# Patient Record
Sex: Female | Born: 1935 | Race: White | Hispanic: No | Marital: Married | Smoking: Former smoker
Health system: Southern US, Community
[De-identification: ages and names within clinical notes are randomized; demographics above are authoritative.]

## PROBLEM LIST (undated history)

## (undated) DIAGNOSIS — Z9889 Other specified postprocedural states: Secondary | ICD-10-CM

## (undated) DIAGNOSIS — Z720 Tobacco use: Secondary | ICD-10-CM

## (undated) DIAGNOSIS — I4891 Unspecified atrial fibrillation: Secondary | ICD-10-CM

## (undated) DIAGNOSIS — C801 Malignant (primary) neoplasm, unspecified: Secondary | ICD-10-CM

## (undated) DIAGNOSIS — R011 Cardiac murmur, unspecified: Secondary | ICD-10-CM

## (undated) DIAGNOSIS — I499 Cardiac arrhythmia, unspecified: Secondary | ICD-10-CM

## (undated) DIAGNOSIS — Z5189 Encounter for other specified aftercare: Secondary | ICD-10-CM

## (undated) DIAGNOSIS — I421 Obstructive hypertrophic cardiomyopathy: Secondary | ICD-10-CM

## (undated) DIAGNOSIS — N6019 Diffuse cystic mastopathy of unspecified breast: Secondary | ICD-10-CM

## (undated) DIAGNOSIS — S7291XA Unspecified fracture of right femur, initial encounter for closed fracture: Secondary | ICD-10-CM

## (undated) DIAGNOSIS — I10 Essential (primary) hypertension: Secondary | ICD-10-CM

## (undated) DIAGNOSIS — IMO0001 Reserved for inherently not codable concepts without codable children: Secondary | ICD-10-CM

## (undated) HISTORY — DX: Essential (primary) hypertension: I10

## (undated) HISTORY — DX: Other specified postprocedural states: Z98.890

## (undated) HISTORY — DX: Tobacco use: Z72.0

## (undated) HISTORY — DX: Malignant (primary) neoplasm, unspecified: C80.1

## (undated) HISTORY — DX: Cardiac murmur, unspecified: R01.1

## (undated) HISTORY — PX: OTHER SURGICAL HISTORY: SHX169

## (undated) HISTORY — DX: Cardiac arrhythmia, unspecified: I49.9

## (undated) HISTORY — DX: Diffuse cystic mastopathy of unspecified breast: N60.19

## (undated) HISTORY — DX: Unspecified fracture of right femur, initial encounter for closed fracture: S72.91XA

## (undated) HISTORY — DX: Encounter for other specified aftercare: Z51.89

## (undated) HISTORY — DX: Obstructive hypertrophic cardiomyopathy: I42.1

## (undated) HISTORY — DX: Reserved for inherently not codable concepts without codable children: IMO0001

---
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
## 1998-04-13 ENCOUNTER — Other Ambulatory Visit: Admission: RE | Admit: 1998-04-13 | Discharge: 1998-04-13 | Payer: Self-pay | Admitting: *Deleted

## 1999-05-14 ENCOUNTER — Other Ambulatory Visit: Admission: RE | Admit: 1999-05-14 | Discharge: 1999-05-14 | Payer: Self-pay | Admitting: *Deleted

## 1999-08-21 ENCOUNTER — Encounter: Admission: RE | Admit: 1999-08-21 | Discharge: 1999-08-21 | Payer: Self-pay | Admitting: *Deleted

## 1999-08-21 ENCOUNTER — Encounter: Payer: Self-pay | Admitting: *Deleted

## 2002-01-27 ENCOUNTER — Encounter: Payer: Self-pay | Admitting: Emergency Medicine

## 2002-01-27 ENCOUNTER — Inpatient Hospital Stay (HOSPITAL_COMMUNITY): Admission: EM | Admit: 2002-01-27 | Discharge: 2002-01-31 | Payer: Self-pay | Admitting: Emergency Medicine

## 2002-01-30 HISTORY — PX: CARDIAC CATHETERIZATION: SHX172

## 2002-11-22 ENCOUNTER — Ambulatory Visit (HOSPITAL_COMMUNITY): Admission: RE | Admit: 2002-11-22 | Discharge: 2002-11-22 | Payer: Self-pay | Admitting: Gastroenterology

## 2003-06-29 ENCOUNTER — Other Ambulatory Visit: Admission: RE | Admit: 2003-06-29 | Discharge: 2003-06-29 | Payer: Self-pay | Admitting: Gynecology

## 2004-07-20 ENCOUNTER — Emergency Department (HOSPITAL_COMMUNITY): Admission: EM | Admit: 2004-07-20 | Discharge: 2004-07-20 | Payer: Self-pay | Admitting: Emergency Medicine

## 2004-07-23 ENCOUNTER — Emergency Department (HOSPITAL_COMMUNITY): Admission: EM | Admit: 2004-07-23 | Discharge: 2004-07-23 | Payer: Self-pay | Admitting: Emergency Medicine

## 2004-08-07 ENCOUNTER — Encounter: Admission: RE | Admit: 2004-08-07 | Discharge: 2004-08-07 | Payer: Self-pay | Admitting: Gastroenterology

## 2005-06-26 HISTORY — PX: US ECHOCARDIOGRAPHY: HXRAD669

## 2005-06-30 ENCOUNTER — Other Ambulatory Visit: Admission: RE | Admit: 2005-06-30 | Discharge: 2005-06-30 | Payer: Self-pay | Admitting: Gynecology

## 2005-12-25 ENCOUNTER — Emergency Department (HOSPITAL_COMMUNITY): Admission: EM | Admit: 2005-12-25 | Discharge: 2005-12-25 | Payer: Self-pay | Admitting: *Deleted

## 2006-05-14 ENCOUNTER — Encounter: Admission: RE | Admit: 2006-05-14 | Discharge: 2006-05-14 | Payer: Self-pay | Admitting: Internal Medicine

## 2006-07-01 HISTORY — PX: US ECHOCARDIOGRAPHY: HXRAD669

## 2007-12-01 HISTORY — PX: US ECHOCARDIOGRAPHY: HXRAD669

## 2008-12-05 HISTORY — PX: US ECHOCARDIOGRAPHY: HXRAD669

## 2010-02-13 ENCOUNTER — Ambulatory Visit: Payer: Self-pay | Admitting: Cardiology

## 2010-04-25 ENCOUNTER — Emergency Department (HOSPITAL_COMMUNITY)
Admission: EM | Admit: 2010-04-25 | Discharge: 2010-04-25 | Payer: Self-pay | Source: Home / Self Care | Admitting: Emergency Medicine

## 2010-06-14 ENCOUNTER — Ambulatory Visit (INDEPENDENT_AMBULATORY_CARE_PROVIDER_SITE_OTHER): Payer: Medicare Other | Admitting: Cardiology

## 2010-06-14 DIAGNOSIS — I421 Obstructive hypertrophic cardiomyopathy: Secondary | ICD-10-CM

## 2010-06-14 DIAGNOSIS — I119 Hypertensive heart disease without heart failure: Secondary | ICD-10-CM

## 2010-08-16 NOTE — Op Note (Signed)
   Tricia [name], Tricia [name]                         ACCOUNT NO.:  [id number]   MEDICAL RECORD NO.:  [id number]                   PATIENT TYPE:  AMB   LOCATION:  ENDO                                 FACILITY:  [facility]   PHYSICIAN:  James L. Malon Kindle., M.D.          DATE OF BIRTH:  10-Dec-1925   DATE OF PROCEDURE:  11/22/2002  DATE OF DISCHARGE:                                 OPERATIVE REPORT   PROCEDURE:  Colonoscopy.   MEDICATIONS:  Fentanyl 60 mcg, Versed 6 mg IV.   SCOPE:  Olympus pediatric colonoscope.   INDICATIONS:  The patient with a strong family history of colon cancer.  One  of her siblings died of colon cancer at 66.   DESCRIPTION OF PROCEDURE:  The procedure had been explained to the patient  and consent obtained.  With the patient in the left lateral decubitus  position, the Olympus pediatric colonoscope was inserted and advanced under  direct visualization.  Prep was excellent.  We were able to advance to the  cecum.  The ileocecal valve and appendiceal orifice were seen.  The scope  was withdrawn.  The cecum, ascending colon, hepatic flexure, transverse  colon, splenic flexure, descending, and sigmoid colon were seen well upon  removal.  The descending colon and sigmoid colon were seen well and were  unremarkable.  The rectum was free of polyps.  The scope was withdrawn.  The  patient tolerated the procedure well and was maintained on a low-flow  oxygen.  Pulse oximetry throughout the procedure with no obvious problems.   ASSESSMENT:  Strong family history of colon cancer with no obvious polyps or  other lesions seen on colonoscopy.   PLAN:  Routine followup and yearly hemoccults.  Would recommend repeating  colonoscopy again in five years.                                                 James L. Malon Kindle., M.D.    Waldron Session  D:  11/22/2002  T:  11/22/2002  Job:  [id number]   cc:   Gwen Pounds, M.D.  [address]  [place]  [place] [address]  Fax:  [phone]

## 2010-10-31 ENCOUNTER — Encounter: Payer: Self-pay | Admitting: Cardiology

## 2010-11-01 ENCOUNTER — Ambulatory Visit (INDEPENDENT_AMBULATORY_CARE_PROVIDER_SITE_OTHER): Payer: Medicare Other | Admitting: Cardiology

## 2010-11-01 ENCOUNTER — Encounter: Payer: Self-pay | Admitting: Cardiology

## 2010-11-01 VITALS — BP 126/78 | HR 74 | Wt 126.0 lb

## 2010-11-01 DIAGNOSIS — Z7189 Other specified counseling: Secondary | ICD-10-CM

## 2010-11-01 DIAGNOSIS — Z716 Tobacco abuse counseling: Secondary | ICD-10-CM | POA: Insufficient documentation

## 2010-11-01 DIAGNOSIS — I421 Obstructive hypertrophic cardiomyopathy: Secondary | ICD-10-CM

## 2010-11-01 DIAGNOSIS — I119 Hypertensive heart disease without heart failure: Secondary | ICD-10-CM

## 2010-11-01 NOTE — Progress Notes (Signed)
Tricia [name] Date of Birth:  04/10/1925 [facility] [address]   [address] [place], [place]  [address] [phone]           Fax   [phone]  HPI: This pleasant 77 year old woman is seen for a scheduled 4 month followup office visit.  She has a history of idiopathic hypertrophic subaortic stenosis.  She also has a history of essential hypertension and history of ongoing cigarette usage.  We talked her about the need for quitting smoking today. Patient has not been expressing any chest pain or shortness of breath.  She's not been aware of any palpitations or tachycardia.  She does have a chronic nonproductive cough related to smoking.  She is on beta blockers which has reduced the intensity of her heart murmur.  Current Outpatient Prescriptions  Medication Sig Dispense Refill  . aspirin 81 MG tablet Take 81 mg by mouth every other day.        Marland Kitchen CALCIUM PO Take by mouth daily.        . metoprolol (TOPROL-XL) 100 MG 24 hr tablet Take 100 mg by mouth daily.        . Multiple Vitamin (MULTIVITAMIN) tablet Take 1 tablet by mouth daily.          Allergies  Allergen Reactions  . Actonel   . Zithromax (Azithromycin)     There is no problem list on file for this patient.   History  Smoking status  . Current Everyday Smoker -- 1.0 packs/day  Smokeless tobacco  . Not on file    History  Alcohol Use No    Family History  Problem Relation Age of Onset  . Heart attack Mother   . Diabetes Father     Review of Systems: The patient denies any heat or cold intolerance.  No weight gain or weight loss.  The patient denies headaches or blurry vision.  There is no cough or sputum production.  The patient denies dizziness.  There is no hematuria or hematochezia.  The patient denies any muscle aches or arthritis.  The patient denies any rash.  The patient denies frequent falling or instability.  There is no history of depression or anxiety.  All other systems  were reviewed and are negative.   Physical Exam: Filed Vitals:   11/01/10 1349  BP: 126/78  Pulse: 74   The general appearance feels a well-developed well-nourished woman in no distress.Pupils equal and reactive.   Extraocular Movements are full.  There is no scleral icterus.  The mouth and pharynx are normal.  The neck is supple.  The carotids reveal no bruits.  The jugular venous pressure is normal.  The thyroid is not enlarged.  There is no lymphadenopathy.  The chest is clear to percussion and auscultation. There are no rales or rhonchi. Expansion of the chest is symmetrical.    The heart reveals a grade 2/6 systolic ejection murmur at the base.  No diastolic murmur.  No gallop or rub.The abdomen is soft and nontender. Bowel sounds are normal. The liver and spleen are not enlarged. There Are no abdominal masses. There are no bruits.  The pedal pulses are good.  There is no phlebitis or edema.  There is no cyanosis or clubbing.  Strength is normal and symmetrical in all extremities.  There is no lateralizing weakness.  There are no sensory deficits.  The skin is warm and dry.  There is no rash.  Assessment / Plan: Continue same medication.  We did an EKG today which shows normal sinus rhythm and LVH and is unchanged from 02/13/10.  Recheck in 6 months for a followup office visit

## 2010-11-04 ENCOUNTER — Encounter: Payer: Self-pay | Admitting: Cardiology

## 2010-11-07 ENCOUNTER — Encounter: Payer: Self-pay | Admitting: Cardiology

## 2010-11-07 ENCOUNTER — Telehealth: Payer: Self-pay | Admitting: Cardiology

## 2010-11-07 ENCOUNTER — Other Ambulatory Visit: Payer: Self-pay | Admitting: Radiology

## 2010-11-07 DIAGNOSIS — C50912 Malignant neoplasm of unspecified site of left female breast: Secondary | ICD-10-CM

## 2010-11-07 NOTE — Telephone Encounter (Signed)
Faxed latest labs today °

## 2010-11-07 NOTE — Telephone Encounter (Signed)
Please fax latest labs

## 2010-11-12 ENCOUNTER — Encounter: Payer: Self-pay | Admitting: Cardiology

## 2010-11-12 ENCOUNTER — Ambulatory Visit
Admission: RE | Admit: 2010-11-12 | Discharge: 2010-11-12 | Disposition: A | Discharge status: Home / Self Care | Payer: Medicare Other | Source: Ambulatory Visit | Attending: Radiology | Admitting: Radiology

## 2010-11-12 DIAGNOSIS — C50912 Malignant neoplasm of unspecified site of left female breast: Secondary | ICD-10-CM

## 2010-11-12 MED ORDER — GADOBENATE DIMEGLUMINE 529 MG/ML IV SOLN
10.0000 mL | Freq: Once | INTRAVENOUS | Status: AC | PRN
  Administered 2010-11-12: 10 mL via INTRAVENOUS

## 2010-11-13 ENCOUNTER — Other Ambulatory Visit: Payer: Self-pay | Admitting: Oncology

## 2010-11-13 ENCOUNTER — Encounter (INDEPENDENT_AMBULATORY_CARE_PROVIDER_SITE_OTHER): Payer: Self-pay | Admitting: General Surgery

## 2010-11-13 ENCOUNTER — Encounter (HOSPITAL_BASED_OUTPATIENT_CLINIC_OR_DEPARTMENT_OTHER): Payer: Medicare Other | Admitting: Oncology

## 2010-11-13 ENCOUNTER — Ambulatory Visit (HOSPITAL_BASED_OUTPATIENT_CLINIC_OR_DEPARTMENT_OTHER): Payer: Medicare Other | Admitting: General Surgery

## 2010-11-13 VITALS — BP 164/89 | HR 70 | Temp 97.9°F | Resp 20 | Ht 63.0 in | Wt 127.1 lb

## 2010-11-13 DIAGNOSIS — C50919 Malignant neoplasm of unspecified site of unspecified female breast: Secondary | ICD-10-CM

## 2010-11-13 DIAGNOSIS — C50219 Malignant neoplasm of upper-inner quadrant of unspecified female breast: Secondary | ICD-10-CM

## 2010-11-13 DIAGNOSIS — C50419 Malignant neoplasm of upper-outer quadrant of unspecified female breast: Secondary | ICD-10-CM

## 2010-11-13 LAB — COMPREHENSIVE METABOLIC PANEL
ALT: 17 U/L (ref 0–35)
AST: 20 U/L (ref 0–37)
Albumin: 3.8 g/dL (ref 3.5–5.2)
Alkaline Phosphatase: 38 U/L — ABNORMAL LOW (ref 39–117)
Potassium: 4 mEq/L (ref 3.5–5.3)
Sodium: 136 mEq/L (ref 135–145)
Total Bilirubin: 0.5 mg/dL (ref 0.3–1.2)
Total Protein: 6.8 g/dL (ref 6.0–8.3)

## 2010-11-13 LAB — CBC WITH DIFFERENTIAL/PLATELET
BASO%: 0.2 % (ref 0.0–2.0)
EOS%: 1.2 % (ref 0.0–7.0)
Eosinophils Absolute: 0.1 10*3/uL (ref 0.0–0.5)
LYMPH%: 20.8 % (ref 14.0–49.7)
MCHC: 34.5 g/dL (ref 31.5–36.0)
MCV: 95.8 fL (ref 79.5–101.0)
MONO%: 4.9 % (ref 0.0–14.0)
NEUT#: 5.2 10*3/uL (ref 1.5–6.5)
Platelets: 171 10*3/uL (ref 145–400)
RBC: 4.34 10*6/uL (ref 3.70–5.45)
RDW: 14.2 % (ref 11.2–14.5)

## 2010-11-13 LAB — CANCER ANTIGEN 27.29: CA 27.29: 9 U/mL (ref 0–39)

## 2010-11-13 NOTE — Progress Notes (Signed)
Subjective:     Patient ID: Tricia [name], female   DOB: 1925/12/06, 77 y.o.   MRN: [id number]  HPI This is an 77 year old Caucasian female, referred by Dr. Minerva Areola storm at [facility] breast imaging. 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 Dr. Ronny Flurry is her primary care physician and cardiologist.  The patient has not had any significant breast disease in the past, although she did have a right breast biopsy in her 35s. She felt a painless mass in the medial aspect of her left breast recently.  MGM's and ultrasounds were done which showed some calcifications and a mass at the 10:00 position of the left breast. Image guided biopsy of that area showed invasive ductal carcinoma, ER-positive, HER-2-negative, T6-T7 10%. There was also a small palpable mass in the 2:00 position of the left breast and that also was biopsied and that showed invasive ductal carcinoma, ER-positive, HER-2 negative, Ki 67 25%. She has had an MRI which shows the 2 areas in the left breast. Right breast looks normal and there is no adenopathy.  She is here today to discuss the management options. She is being seen in the breast clinic by Dr. Pierce Crane, Dr. Basilio Cairo, and me.  Significant family history is that her mother developed breast cancer at age 65. She died 3 years later but that might have been due to coronary artery disease.  Patient has significant history of tobacco abuse and idiopathic hypertrophic subaortic stenosis and hypertension.  Past Medical History  Diagnosis Date  . Idiopathic hypertrophic subaortic stenosis   . Irregular heart beat   . Fibrocystic breast disease   . Tobacco abuse   . Hypertension   . History of colonoscopy    Current Outpatient Prescriptions  Medication Sig Dispense Refill  . glucosamine-chondroitin 500-400 MG tablet Take 500 tablets by mouth daily.        Marland Kitchen aspirin 81 MG tablet Take 81 mg by mouth every other day.        Marland Kitchen CALCIUM PO  Take by mouth daily.        . metoprolol (TOPROL-XL) 100 MG 24 hr tablet Take 50 mg by mouth daily.       . Multiple Vitamin (MULTIVITAMIN) tablet Take 1 tablet by mouth daily.         Allergies  Allergen Reactions  . Actonel   . Zithromax (Azithromycin)     Family History  Problem Relation Age of Onset  . Heart attack Mother   . Breast cancer Mother   . Diabetes Father   . Stomach cancer Brother     History  Substance Use Topics  . Smoking status: Current Everyday Smoker -- 1.0 packs/day  . Smokeless tobacco: Not on file  . Alcohol Use: Yes     Review of Systems  Constitutional: Negative.   HENT: Negative.   Eyes: Negative.   Respiratory: Positive for cough and shortness of breath. Negative for apnea, choking, chest tightness, wheezing and stridor.   Cardiovascular: Negative.   Gastrointestinal: Negative.   Genitourinary: Negative.   Neurological: Negative.   Hematological: Negative.   Psychiatric/Behavioral: Negative.        Objective:   Physical Exam  Constitutional: She is oriented to person, place, and time. She appears well-developed and well-nourished. No distress.  HENT:  Head: Normocephalic and atraumatic.  Nose: Nose normal.  Mouth/Throat: No oropharyngeal exudate.  Eyes: Conjunctivae and EOM are normal. Pupils are equal, round, and reactive  to light. Left eye exhibits no discharge. No scleral icterus.  Neck: Neck supple. No JVD present. No tracheal deviation present. No thyromegaly present.  Cardiovascular: Normal rate, regular rhythm, normal heart sounds and intact distal pulses.   No murmur heard. Pulmonary/Chest: Effort normal and breath sounds normal. No respiratory distress. She has no wheezes. She has no rales. She exhibits no tenderness.    Abdominal: Soft. Bowel sounds are normal. She exhibits no distension and no mass. There is no tenderness. There is no rebound and no guarding.    Musculoskeletal: She exhibits no edema and no tenderness.    Lymphadenopathy:    She has no cervical adenopathy.  Neurological: She is alert and oriented to person, place, and time. She exhibits normal muscle tone. Coordination normal.  Skin: Skin is warm. No rash noted. She is not diaphoretic. No erythema. No pallor.  Psychiatric: She has a normal mood and affect. Her behavior is normal. Judgment and thought content normal.       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 There is a T1 C. tumor at 10:00 and a T2 tumor at 2:00. These are ER-positive, HER-2-negative.  Extensive ecchymosis post biopsy.  Idiopathic hypertrophic subaortic stenosis.  Tobacco abuse, one pack per day.  Hypertension.    Plan:     I had a very, very long talk with the patient and her son. We talked about mastectomy and sentinel lobe biopsy. We talked about doing 2 lumpectomies and postop radiation therapy. We talked about neoadjuvant antiestrogen therapy. This was seemingly confusing to her.  It took a long time for her to come to a decision, but at the end of the 3 consultations and conversation we all decided to put her on neoadjuvant antiestrogen therapy, hopefully downstage both tumors.  and to plan for lumpectomy x2 in the future with adjuvant radiation therapy to follow. She will probably need antiestrogen therapy for 5 years.  She will followup with me in the office in 3 months.  She will followup with Dr. Pierce Crane frequently because of anti-estrogen therapy.  I offered to see her sooner if she had more questions or wanted to change her mind.  She was strongly encouraged to stop smoking.

## 2010-11-13 NOTE — Patient Instructions (Signed)
You have two invasive cancers in your left breast, one in the upper inner quadrant and one in the upper outer quadrant. We have talked about mastectomy and lymph node biopsy. We have talked about doing lumpectomy on both of these areas and postop radiation therapy. We have talked about shrinking the tumor is down with anti-estrogen pills. I have discussed this with your son as well. Our decision at this time is for you to undergo preoperative treatment with anti-estrogen pills which will be prescribed by Dr. Pierce Crane. I will see you every 3 months, and hopefully the tumors will shrink in size to the extent that I can safely do two separate lumpectomies and possible lymph node biopsy. You will need radiation therapy after that is done. My office will make an appointment to see me in 3 months. Please contact me if you have any questions or change your mind.

## 2010-11-19 ENCOUNTER — Ambulatory Visit
Admission: RE | Admit: 2010-11-19 | Discharge: 2010-11-19 | Disposition: A | Discharge status: Home / Self Care | Payer: Medicare Other | Source: Ambulatory Visit | Attending: Oncology | Admitting: Oncology

## 2010-11-19 DIAGNOSIS — C50919 Malignant neoplasm of unspecified site of unspecified female breast: Secondary | ICD-10-CM

## 2010-11-22 ENCOUNTER — Encounter (HOSPITAL_COMMUNITY)
Admission: RE | Admit: 2010-11-22 | Discharge: 2010-11-22 | Disposition: A | Discharge status: Home / Self Care | Payer: Medicare Other | Source: Ambulatory Visit | Attending: Oncology | Admitting: Oncology

## 2010-11-22 ENCOUNTER — Ambulatory Visit (HOSPITAL_COMMUNITY)
Admission: RE | Admit: 2010-11-22 | Discharge: 2010-11-22 | Disposition: A | Discharge status: Home / Self Care | Payer: Medicare Other | Source: Ambulatory Visit | Attending: Oncology | Admitting: Oncology

## 2010-11-22 ENCOUNTER — Other Ambulatory Visit (HOSPITAL_COMMUNITY): Payer: Medicare Other

## 2010-11-22 ENCOUNTER — Ambulatory Visit (HOSPITAL_COMMUNITY): Payer: Medicare Other

## 2010-11-22 ENCOUNTER — Encounter (HOSPITAL_COMMUNITY): Payer: Medicare Other

## 2010-11-22 DIAGNOSIS — E279 Disorder of adrenal gland, unspecified: Secondary | ICD-10-CM | POA: Insufficient documentation

## 2010-11-22 DIAGNOSIS — K449 Diaphragmatic hernia without obstruction or gangrene: Secondary | ICD-10-CM | POA: Insufficient documentation

## 2010-11-22 DIAGNOSIS — M47814 Spondylosis without myelopathy or radiculopathy, thoracic region: Secondary | ICD-10-CM | POA: Insufficient documentation

## 2010-11-22 DIAGNOSIS — C50919 Malignant neoplasm of unspecified site of unspecified female breast: Secondary | ICD-10-CM | POA: Insufficient documentation

## 2010-11-22 DIAGNOSIS — M5137 Other intervertebral disc degeneration, lumbosacral region: Secondary | ICD-10-CM | POA: Insufficient documentation

## 2010-11-22 DIAGNOSIS — M899 Disorder of bone, unspecified: Secondary | ICD-10-CM | POA: Insufficient documentation

## 2010-11-22 DIAGNOSIS — Q762 Congenital spondylolisthesis: Secondary | ICD-10-CM | POA: Insufficient documentation

## 2010-11-22 DIAGNOSIS — M51379 Other intervertebral disc degeneration, lumbosacral region without mention of lumbar back pain or lower extremity pain: Secondary | ICD-10-CM | POA: Insufficient documentation

## 2010-11-22 MED ORDER — IOHEXOL 300 MG/ML  SOLN
100.0000 mL | Freq: Once | INTRAMUSCULAR | Status: AC | PRN
  Administered 2010-11-22: 100 mL via INTRAVENOUS

## 2010-11-22 MED ORDER — TECHNETIUM TC 99M MEDRONATE IV KIT
25.0000 | PACK | Freq: Once | INTRAVENOUS | Status: AC | PRN
  Administered 2010-11-22: 25 via INTRAVENOUS

## 2010-11-26 ENCOUNTER — Encounter: Payer: Self-pay | Admitting: Cardiology

## 2010-12-03 ENCOUNTER — Other Ambulatory Visit (HOSPITAL_COMMUNITY): Payer: Medicare Other

## 2010-12-03 ENCOUNTER — Ambulatory Visit (HOSPITAL_COMMUNITY): Payer: Medicare Other

## 2010-12-03 ENCOUNTER — Encounter (HOSPITAL_COMMUNITY): Payer: Medicare Other

## 2010-12-19 ENCOUNTER — Other Ambulatory Visit: Payer: Self-pay | Admitting: Oncology

## 2010-12-19 ENCOUNTER — Encounter (HOSPITAL_BASED_OUTPATIENT_CLINIC_OR_DEPARTMENT_OTHER): Payer: Medicare Other | Admitting: Oncology

## 2010-12-19 DIAGNOSIS — C50219 Malignant neoplasm of upper-inner quadrant of unspecified female breast: Secondary | ICD-10-CM

## 2010-12-19 LAB — CBC WITH DIFFERENTIAL/PLATELET
BASO%: 0.5 % (ref 0.0–2.0)
HCT: 44.2 % (ref 34.8–46.6)
LYMPH%: 18.7 % (ref 14.0–49.7)
MCH: 33.4 pg (ref 25.1–34.0)
MCHC: 34.7 g/dL (ref 31.5–36.0)
MONO#: 0.4 10*3/uL (ref 0.1–0.9)
NEUT%: 74.7 % (ref 38.4–76.8)
Platelets: 160 10*3/uL (ref 145–400)
WBC: 8.1 10*3/uL (ref 3.9–10.3)

## 2010-12-20 LAB — COMPREHENSIVE METABOLIC PANEL
ALT: 14 U/L (ref 0–35)
CO2: 27 mEq/L (ref 19–32)
Creatinine, Ser: 0.79 mg/dL (ref 0.50–1.10)
Total Bilirubin: 0.6 mg/dL (ref 0.3–1.2)

## 2010-12-20 LAB — VITAMIN D 25 HYDROXY (VIT D DEFICIENCY, FRACTURES): Vit D, 25-Hydroxy: 23 ng/mL — ABNORMAL LOW (ref 30–89)

## 2010-12-26 ENCOUNTER — Encounter (HOSPITAL_BASED_OUTPATIENT_CLINIC_OR_DEPARTMENT_OTHER): Payer: Medicare Other | Admitting: Oncology

## 2010-12-26 DIAGNOSIS — C50919 Malignant neoplasm of unspecified site of unspecified female breast: Secondary | ICD-10-CM

## 2010-12-26 DIAGNOSIS — Z170 Estrogen receptor positive status [ER+]: Secondary | ICD-10-CM

## 2010-12-26 DIAGNOSIS — E559 Vitamin D deficiency, unspecified: Secondary | ICD-10-CM

## 2010-12-26 DIAGNOSIS — N63 Unspecified lump in unspecified breast: Secondary | ICD-10-CM

## 2011-01-03 ENCOUNTER — Encounter (HOSPITAL_BASED_OUTPATIENT_CLINIC_OR_DEPARTMENT_OTHER): Payer: Medicare Other | Admitting: Oncology

## 2011-01-03 DIAGNOSIS — M810 Age-related osteoporosis without current pathological fracture: Secondary | ICD-10-CM

## 2011-01-03 DIAGNOSIS — C50119 Malignant neoplasm of central portion of unspecified female breast: Secondary | ICD-10-CM

## 2011-01-11 ENCOUNTER — Other Ambulatory Visit: Payer: Self-pay | Admitting: Cardiology

## 2011-01-13 NOTE — Telephone Encounter (Signed)
Refilled toprol

## 2011-01-20 ENCOUNTER — Telehealth: Payer: Self-pay | Admitting: Cardiology

## 2011-01-20 NOTE — Telephone Encounter (Signed)
Left message with name of group and if any more questions to call back

## 2011-01-20 NOTE — Telephone Encounter (Signed)
Pt's son wants to know any general doctor that she can go to for signs of early dementia. He lives in [place] and doesn't know any in area.  Please call

## 2011-02-03 ENCOUNTER — Other Ambulatory Visit (HOSPITAL_BASED_OUTPATIENT_CLINIC_OR_DEPARTMENT_OTHER): Payer: Medicare Other | Admitting: Lab

## 2011-02-03 ENCOUNTER — Other Ambulatory Visit: Payer: Self-pay | Admitting: Oncology

## 2011-02-03 DIAGNOSIS — C50919 Malignant neoplasm of unspecified site of unspecified female breast: Secondary | ICD-10-CM

## 2011-02-03 LAB — CBC WITH DIFFERENTIAL/PLATELET
Eosinophils Absolute: 0.1 10*3/uL (ref 0.0–0.5)
LYMPH%: 17.8 % (ref 14.0–49.7)
MONO#: 0.4 10*3/uL (ref 0.1–0.9)
NEUT#: 5.1 10*3/uL (ref 1.5–6.5)
Platelets: 157 10*3/uL (ref 145–400)
RBC: 4.4 10*6/uL (ref 3.70–5.45)
WBC: 6.9 10*3/uL (ref 3.9–10.3)

## 2011-02-04 LAB — COMPREHENSIVE METABOLIC PANEL
AST: 19 U/L (ref 0–37)
Albumin: 4.2 g/dL (ref 3.5–5.2)
Alkaline Phosphatase: 31 U/L — ABNORMAL LOW (ref 39–117)
Chloride: 98 mEq/L (ref 96–112)
Glucose, Bld: 114 mg/dL — ABNORMAL HIGH (ref 70–99)
Potassium: 4.3 mEq/L (ref 3.5–5.3)
Sodium: 134 mEq/L — ABNORMAL LOW (ref 135–145)
Total Protein: 6.4 g/dL (ref 6.0–8.3)

## 2011-02-04 LAB — VITAMIN D 25 HYDROXY (VIT D DEFICIENCY, FRACTURES): Vit D, 25-Hydroxy: 32 ng/mL (ref 30–89)

## 2011-02-05 ENCOUNTER — Telehealth (INDEPENDENT_AMBULATORY_CARE_PROVIDER_SITE_OTHER): Payer: Self-pay

## 2011-02-05 NOTE — Telephone Encounter (Signed)
Per Dr Jacinto Halim order pt given ltf 21mo reck appt for 03-03-11 to f/u neoadj. Treatment.

## 2011-02-10 ENCOUNTER — Telehealth: Payer: Self-pay | Admitting: *Deleted

## 2011-02-10 ENCOUNTER — Ambulatory Visit (HOSPITAL_BASED_OUTPATIENT_CLINIC_OR_DEPARTMENT_OTHER): Payer: Medicare Other | Admitting: Oncology

## 2011-02-10 VITALS — BP 146/85 | HR 69 | Temp 97.9°F | Ht 63.0 in | Wt 124.3 lb

## 2011-02-10 DIAGNOSIS — F17200 Nicotine dependence, unspecified, uncomplicated: Secondary | ICD-10-CM

## 2011-02-10 DIAGNOSIS — Z170 Estrogen receptor positive status [ER+]: Secondary | ICD-10-CM

## 2011-02-10 DIAGNOSIS — E559 Vitamin D deficiency, unspecified: Secondary | ICD-10-CM

## 2011-02-10 DIAGNOSIS — C50919 Malignant neoplasm of unspecified site of unspecified female breast: Secondary | ICD-10-CM

## 2011-02-10 NOTE — Progress Notes (Deleted)
Hematology and Oncology Follow Up Visit  Tricia [name] [id number] [birth year] 77 y.o. 02/10/2011 12:00 PM  Error pls see dictated note Principle Diagnosis: ***  Interim History:  ***  Medications: {medication reviewed/display:3041432}  Allergies: No Active Allergies  Past Medical History, Surgical history, Social history, and Family History were reviewed and updated.  Review of Systems: Constitutional:  Negative for fever, chills, night sweats, anorexia, weight loss, pain. Cardiovascular: {roscv:310661} Respiratory: {ros resp:310659} Neurological: {rosneuro:310663} Dermatological: {ros skin:310673} ENT: {rosent:310657} Skin Gastrointestinal: {ros gi:310669} Genito-Urinary: {rosgu:310671} Hematological and Lymphatic: {rosheme/lymph:310665} Breast: {ros breast:311036} Musculoskeletal: {ros musculoskeletal:310677} Remaining ROS negative.  Physical Exam: Blood pressure 146/85, pulse 69, temperature 97.9 F (36.6 C), height 5\' 3"  (1.6 m), weight 124 lb 4.8 oz (56.382 kg). ECOG:  General appearance: {general exam:16600} Head: {head exam:30909::"Normocephalic, without obvious abnormality","atraumatic"} Neck: {neck exam:17463::"no adenopathy","no carotid bruit","no JVD","supple, symmetrical, trachea midline","thyroid not enlarged, symmetric, no tenderness/mass/nodules"} Lymph nodes: {lymph node exam:14039::"Cervical, supraclavicular, and axillary nodes normal."} Cardiac :  Pulmonary: Breasts: Abdomen: Extremities Neuro:  Lab Results: Lab Results  Component Value Date   HGB 14.5 02/03/2011   HCT 42.2 02/03/2011   MCV 95.9 02/03/2011   PLT 157 02/03/2011     Chemistry      Component Value Date/Time   NA 134* 02/03/2011 1133   K 4.3 02/03/2011 1133   CL 98 02/03/2011 1133   CO2 28 02/03/2011 1133   BUN 21 02/03/2011 1133   CREATININE 0.77 02/03/2011 1133      Component Value Date/Time   CALCIUM 9.5 02/03/2011 1133   ALKPHOS 31* 02/03/2011 1133   AST 19 02/03/2011 1133   ALT 14 02/03/2011 1133   BILITOT 0.5 02/03/2011 1133       Radiological Studies: chest X-ray *** Mammogram *** Bone density ***  Impression and Plan: ***  More than 50% of the visit was spent in patient-related counselling   Pierce Crane, MD 11/12/201212:00 PM

## 2011-02-10 NOTE — Telephone Encounter (Signed)
GAVE PATIENT APPOINTMENT FOR ULTRASOUND OF THE LEFT BREAST AT THE [facility] ON 03-21-2011 AT 10:00AM.

## 2011-02-10 NOTE — Progress Notes (Signed)
CC:   Angelia Mould. Derrell Lolling, M.D. [name], M.D. Gwen Pounds, MD  PROBLEM: 1. Locally advanced ER positive, PR positive breast cancer, on Femara     since August 2012. 2. History of smoking. 3. History of likely dementia. 4. History of idiopathic hypertrophic subaortic stenosis.  INTERVAL HISTORY:  Ms. Griffiths is here with her son from [place].  He is visiting with his parents to try to get her care facilitated with a primary care doctor as he has ongoing concerns about her likely dementia.  She is on Femara and tolerates it pretty well.  She thinks that the mass in her left breast may be somewhat smaller.  Other than that, she seems to be doing well.  She had no other incurrent illnesses or complaints.  She has not seen Dr. Derrell Lolling recently.  PHYSICAL EXAMINATION:  General:  Pleasant, alert woman looking stated age.  Vital Signs: Blood pressure is 146/85, temperature 97.9, pulse 69,.  Weight is 124.  ECOG is 1.  Head and Neck Exam:  No palpable peripheral adenopathy.  Oropharynx normal.  Lungs:  Clear.  Heart: Sounds are normal.  Systolic murmur heard left sternal border, 2/6.  No bruit.  Breasts:  Right breast normal.  Left breast has an easily palpable mass in the upper outer quadrant of the breast.  This is unchanged.  In the medial breast, there is slight residual nodularity that I can appreciate.  I cannot really detect discrete size of this mass.  Both axillae are negative.  There is no nipple retraction or skin changes.  Abdomen:  Soft.  There is no palpable hepatosplenomegaly.  No inguinal adenopathy.  Extremities:  No peripheral cyanosis, clubbing or edema.  LABORATORY DATA:  Within normal limits.  CBC is within normal limits.  IMPRESSION AND PLAN:  Ms. Sloma is doing well.  We will follow her up with an ultrasound in 6 weeks and have her see Dr. Derrell Lolling.  I will see if Dr. Timothy Lasso can also pick her up in his practice to fully assess her dementia and see what other  medical issues need to be dealt with.  She, of course, still smokes.  I will see her in about 2 months' time.    ______________________________ Pierce Crane, M.D., F.R.C.P.C. PR/MEDQ  D:  02/10/2011  T:  02/10/2011  Job:  [id number]

## 2011-02-10 NOTE — Telephone Encounter (Signed)
GAVE PATIENT APPOINTMENT FOR 05-2011 

## 2011-03-03 ENCOUNTER — Ambulatory Visit (INDEPENDENT_AMBULATORY_CARE_PROVIDER_SITE_OTHER): Payer: Medicare Other | Admitting: General Surgery

## 2011-03-03 ENCOUNTER — Telehealth (INDEPENDENT_AMBULATORY_CARE_PROVIDER_SITE_OTHER): Payer: Self-pay

## 2011-03-03 NOTE — Telephone Encounter (Signed)
I have mailed note advising pt she missed her appt today and have also sent her a card with her new appt date of 04-02-11. I see she has an ultrasound at [facility] 12-20 and Dr Gita Kudo note indicated pt is to f/u with Dr Derrell Lolling after u/s.

## 2011-04-02 ENCOUNTER — Ambulatory Visit (INDEPENDENT_AMBULATORY_CARE_PROVIDER_SITE_OTHER): Payer: Medicare Other | Admitting: General Surgery

## 2011-04-02 ENCOUNTER — Encounter (INDEPENDENT_AMBULATORY_CARE_PROVIDER_SITE_OTHER): Payer: Self-pay | Admitting: General Surgery

## 2011-04-02 VITALS — BP 132/76 | HR 70 | Temp 98.1°F | Resp 18 | Ht 65.0 in | Wt 123.2 lb

## 2011-04-02 DIAGNOSIS — C50912 Malignant neoplasm of unspecified site of left female breast: Secondary | ICD-10-CM

## 2011-04-02 DIAGNOSIS — C50919 Malignant neoplasm of unspecified site of unspecified female breast: Secondary | ICD-10-CM

## 2011-04-02 NOTE — Patient Instructions (Signed)
You will be scheduled for a left breast ultrasound at this time. Keep your appointment with Dr. Donnie Coffin. Return to see me in 6 weeks. At her next visit we will decide whether to continue treatment of the cancer with the Femara medicine, or whether to go ahead with surgery.

## 2011-04-02 NOTE — Progress Notes (Signed)
Patient ID: Tricia [name], female   DOB: 02-26-1926, 78 y.o.   MRN: [id number]  Chief Complaint  Patient presents with  . Breast Cancer Long Term Follow Up    Follow up to neoadj terapy. U/S 03/21/11.    HPI Tricia [name] is a 78 y.o. female.  She returned with her husband and one of her sons regarding her left breast cancer, which is being treated in the neoadjuvant setting.  This patient was initially seen in the breast clinic on November 13, 2010 by Dr. Donnie Coffin, Dr. Basilio Cairo, and me.  She has suspected  dementia and has poor memory. We had difficulty explaining her situation to her.  She has multicentric cancer of the left breast. There is invasive ductal carcinoma in the upper inner quadrant of the right breast, and a palpable invasive carcinoma in the upper outer quadrant of the left breast. These are receptor positive, HER-2/neu negative. The initial treatment plan was to treat this in the neoadjuvant setting with Femara in hopes of downstaging of tumor and be able to do 2 lumpectomies. She was given the alternative of mastectomy and did not like that idea. Her level of insight and understanding is under question.  She last saw Dr. Donnie Coffin on February 10, 2011. He scheduled her for a left breast ultrasound which was canceled for some reason. She is here today with her husband. They both had poor insight. The husband states that he knows she has breast cancer. She says that no one has ever told her she had breast cancer. We brought her son in the room and he states that they both have serious memory problems and that's why he lives with them. Another son lives in [place].  In any event, she states she is taking a Femara and has no new complaints about her breast. She does not know that she was supposed to get an ultrasound.HPI  Past Medical History  Diagnosis Date  . Idiopathic hypertrophic subaortic stenosis   . Irregular heart beat   . Fibrocystic breast disease   . Tobacco abuse   .  Hypertension   . History of colonoscopy   . Heart murmur   . Blood transfusion   . Cancer     left breast    Past Surgical History  Procedure Date  . Cardiac catheterization 01/30/2002    EF 75-80%  . US echocardiography 12/05/2008    EF 60-65%  . US echocardiography 12/01/2007    EF 60-65%  . US echocardiography 07/01/2006    EF 55-60%  . US echocardiography 06/26/2005    EF 55-60%  . Appendectomy 1946    Family History  Problem Relation Age of Onset  . Heart attack Mother   . Breast cancer Mother   . Cancer Mother     breast  . Diabetes Father   . Stomach cancer Brother   . Cancer Brother     colon    Social History History  Substance Use Topics  . Smoking status: Current Everyday Smoker -- 1.0 packs/day  . Smokeless tobacco: Never Used  . Alcohol Use: Yes     1.5 oz of rum in afternoon    No Known Allergies  Current Outpatient Prescriptions  Medication Sig Dispense Refill  . metoprolol tartrate (LOPRESSOR) 25 MG tablet Take 25 mg by mouth daily. Patient unsure of dosage. To be confirmed on next visit.       Marland Kitchen aspirin 81 MG tablet Take 81 mg by mouth every  other day.        Marland Kitchen CALCIUM PO Take by mouth daily.        Marland Kitchen glucosamine-chondroitin 500-400 MG tablet Take 1 tablet by mouth daily.       Marland Kitchen letrozole (FEMARA) 2.5 MG tablet Take 2.5 mg by mouth daily.        . Multiple Vitamin (MULTIVITAMIN) tablet Take 1 tablet by mouth daily.        . TOPROL XL 100 MG 24 hr tablet TAKE 1 TABLET EACH DAY.  30 each  11    Review of Systems Review of Systems  Respiratory: Negative for apnea, chest tightness and shortness of breath.   Cardiovascular: Negative for chest pain and palpitations.  Gastrointestinal: Negative for vomiting and abdominal distention.  Psychiatric/Behavioral: Positive for confusion. The patient is nervous/anxious.     Blood pressure 132/76, pulse 70, temperature 98.1 F (36.7 C), temperature source Temporal, resp. rate 18, height 5\' 5"  (1.651 m),  weight 123 lb 3.2 oz (55.883 kg).  Physical Exam Physical Exam  Neck: Normal range of motion. Neck supple. No JVD present.  Cardiovascular: Normal rate, regular rhythm and normal heart sounds.   No murmur heard. Pulmonary/Chest: Breath sounds normal. No respiratory distress. She has no wheezes. She has no rales.    Lymphadenopathy:    She has no cervical adenopathy.    Data Reviewed I have reviewed Dr. Renelda Loma office notes. I am going to attempt to reschedule her ultrasound.  Assessment    Multicentric invasive cancer left breast, upper inner quadrant  (nonpalpable)  and upper outer quadrant (palpable) . I am not sure whether she is responding to Femara or not.  Tumors are receptor positive, HER-2 negative.  Suspect significant dementia  Tobacco abuse  History of idiopathic hypertrophic subaortic stenosis, followed by Dr. Matilde Haymaker and Dr. Toy Baker.    Plan    Left breast ultrasound is rescheduled.  Await medical evaluation of dementia and medical problems by Dr. Timothy Lasso.  Continue Femara  I have told the patient and her family that if she still wanted to consider 2 lumpectomies that we will try to continue the Femara. If she changes her mind and wants a mastectomy that is appropriate to proceed with that at  any time.  I did the best I could to help Them understand what is a complex decision-making process for them. I think we will simply have to go slowly  I will see her in 6 weeks after the ultrasound. If the tumors have been down staged then we will decide about continuing the Femara. If she is not responding to from the Femara I will discuss with Dr. Donnie Coffin about going ahead with surgery       Aileene Lanum M 04/02/2011, 4:20 PM

## 2011-04-10 ENCOUNTER — Encounter: Payer: Self-pay | Admitting: Oncology

## 2011-04-16 ENCOUNTER — Encounter: Payer: Self-pay | Admitting: Cardiology

## 2011-04-16 ENCOUNTER — Ambulatory Visit (INDEPENDENT_AMBULATORY_CARE_PROVIDER_SITE_OTHER): Payer: Medicare Other | Admitting: Cardiology

## 2011-04-16 VITALS — BP 128/80 | HR 70 | Ht 65.0 in | Wt 122.0 lb

## 2011-04-16 DIAGNOSIS — Z716 Tobacco abuse counseling: Secondary | ICD-10-CM

## 2011-04-16 DIAGNOSIS — I421 Obstructive hypertrophic cardiomyopathy: Secondary | ICD-10-CM

## 2011-04-16 DIAGNOSIS — M129 Arthropathy, unspecified: Secondary | ICD-10-CM

## 2011-04-16 DIAGNOSIS — I119 Hypertensive heart disease without heart failure: Secondary | ICD-10-CM

## 2011-04-16 DIAGNOSIS — Z7189 Other specified counseling: Secondary | ICD-10-CM

## 2011-04-16 DIAGNOSIS — M1990 Unspecified osteoarthritis, unspecified site: Secondary | ICD-10-CM

## 2011-04-16 NOTE — Assessment & Plan Note (Signed)
We again advised her to quit smoking.  She states that there is too much stress in her home for her to make that decision right now.

## 2011-04-16 NOTE — Assessment & Plan Note (Signed)
The patient has not been experiencing any chest pain or increased shortness of breath.  She has not been aware of any palpitations.  She's had no dizziness or syncope from her IHSS.

## 2011-04-16 NOTE — Patient Instructions (Signed)
Your physician recommends that you continue on your current medications as directed. Please refer to the Current Medication list given to you today. Your physician wants you to follow-up in: 4 month You will receive a reminder letter in the mail two months in advance. If you don't receive a letter, please call our office to schedule the follow-up appointment.  

## 2011-04-16 NOTE — Progress Notes (Signed)
Devonne Doughty Date of Birth:  Apr 10, 1925 [facility] [address] [place], [place]  [address] [phone]  Fax   [phone]  HPI: This pleasant 78 year old woman is seen for a scheduled four-month followup office visit.  She has a past history of IHSS and a past history of essential hypertension.  She also has ongoing tobacco abuse.  Since last visit she has been feeling well with no new cardiac complaints.  Current Outpatient Prescriptions  Medication Sig Dispense Refill  . aspirin 81 MG tablet Take 81 mg by mouth every other day.        Marland Kitchen CALCIUM PO Take by mouth daily.        Marland Kitchen glucosamine-chondroitin 500-400 MG tablet Take 1 tablet by mouth daily.       Marland Kitchen letrozole (FEMARA) 2.5 MG tablet Take 2.5 mg by mouth daily.        . Multiple Vitamin (MULTIVITAMIN) tablet Take 1 tablet by mouth daily.        . TOPROL XL 100 MG 24 hr tablet TAKE 1 TABLET EACH DAY.  30 each  11    No Known Allergies  Patient Active Problem List  Diagnoses  . IHSS (idiopathic hypertrophic subaortic stenosis)  . Tobacco abuse counseling  . Benign hypertensive heart disease without heart failure  . Left Breast cancer, T1cNxMx, ER/PR+, Her2Neu -    History  Smoking status  . Current Everyday Smoker -- 1.0 packs/day  Smokeless tobacco  . Never Used    History  Alcohol Use  . Yes    1.5 oz of rum in afternoon    Family History  Problem Relation Age of Onset  . Heart attack Mother   . Breast cancer Mother   . Cancer Mother     breast  . Diabetes Father   . Stomach cancer Brother   . Cancer Brother     colon    Review of Systems: The patient denies any heat or cold intolerance.  No weight gain or weight loss.  The patient denies headaches or blurry vision.  There is no cough or sputum production.  The patient denies dizziness.  There is no hematuria or hematochezia.  The patient denies any muscle aches or arthritis.  The patient denies any rash.  The patient denies  frequent falling or instability.  There is no history of depression or anxiety.  All other systems were reviewed and are negative.   Physical Exam: Filed Vitals:   04/16/11 1003  BP: 128/80  Pulse: 70   physical examination reveals a well-developed well-nourished woman in no distress.Pupils equal and reactive.   Extraocular Movements are full.  There is no scleral icterus.  The mouth and pharynx are normal.  The neck is supple.  The carotids reveal no bruits.  The jugular venous pressure is normal.  The thyroid is not enlarged.  There is no lymphadenopathy.  The chest is clear to percussion and auscultation. There are no rales or rhonchi. Expansion of the chest is symmetrical.  The heart reveals a grade 2/6 systolic ejection murmur at the base.  No diastolic murmur.  No gallop or rub.The abdomen is soft and nontender. Bowel sounds are normal. The liver and spleen are not enlarged. There Are no abdominal masses. There are no bruits.  The pedal pulses are good.  There is no phlebitis or edema.  There is no cyanosis or clubbing. Strength is normal and symmetrical in all extremities.  There is no lateralizing  weakness.  There are no sensory deficits.  The skin is warm and dry.  There is no rash.     Assessment / Plan:  Continue same medication.  Recheck in 4 months for office visit and EKG.  Counseled about smoking

## 2011-04-16 NOTE — Assessment & Plan Note (Signed)
The patient has not been having any headaches or dizziness.  Has been remaining stable on current therapy.  She is not sure of her metoprolol dose.  We think that it is metoprolol succinate 100 mg one daily and she will call us if there is anything different.

## 2011-05-01 ENCOUNTER — Encounter (HOSPITAL_COMMUNITY): Payer: Self-pay | Admitting: General Practice

## 2011-05-01 ENCOUNTER — Emergency Department (HOSPITAL_COMMUNITY): Payer: Medicare Other

## 2011-05-01 ENCOUNTER — Emergency Department (HOSPITAL_COMMUNITY)
Admission: EM | Admit: 2011-05-01 | Discharge: 2011-05-01 | Disposition: A | Discharge status: Home / Self Care | Payer: Medicare Other | Attending: Emergency Medicine | Admitting: Emergency Medicine

## 2011-05-01 DIAGNOSIS — I421 Obstructive hypertrophic cardiomyopathy: Secondary | ICD-10-CM | POA: Insufficient documentation

## 2011-05-01 DIAGNOSIS — S0990XA Unspecified injury of head, initial encounter: Secondary | ICD-10-CM | POA: Insufficient documentation

## 2011-05-01 DIAGNOSIS — W1809XA Striking against other object with subsequent fall, initial encounter: Secondary | ICD-10-CM | POA: Insufficient documentation

## 2011-05-01 DIAGNOSIS — R42 Dizziness and giddiness: Secondary | ICD-10-CM | POA: Insufficient documentation

## 2011-05-01 DIAGNOSIS — I10 Essential (primary) hypertension: Secondary | ICD-10-CM | POA: Insufficient documentation

## 2011-05-01 DIAGNOSIS — Z853 Personal history of malignant neoplasm of breast: Secondary | ICD-10-CM | POA: Insufficient documentation

## 2011-05-01 DIAGNOSIS — R51 Headache: Secondary | ICD-10-CM | POA: Insufficient documentation

## 2011-05-01 DIAGNOSIS — F17200 Nicotine dependence, unspecified, uncomplicated: Secondary | ICD-10-CM | POA: Insufficient documentation

## 2011-05-01 NOTE — ED Notes (Signed)
MD at bedside. 

## 2011-05-01 NOTE — ED Provider Notes (Signed)
History     CSN: [id number]  Arrival date & time 05/01/11  1553   First MD Initiated Contact with Patient 05/01/11 1613      Chief Complaint  Patient presents with  . Fall    (Consider location/radiation/quality/duration/timing/severity/associated sxs/prior treatment) Patient is a 78 y.o. female presenting with fall. The history is provided by the patient, the spouse and a relative.  Fall Associated symptoms include headaches. Pertinent negatives include no nausea and no vomiting.   the patient is an 78 year old, female, with a history of hypertension, and idiopathic hypertrophic subaortic stenosis.  She was shopping and reached up, became dizzy and fell backward and struck her head.  She denies loss of consciousness.  She denies vision changes, nausea, or vomiting.  She denies neck pain, weakness, or paresthesias.  She denies recent illness.  She is not taking any anticoagulants except for aspirin.  Does not want any pain medicine.  At this time.  Past Medical History  Diagnosis Date  . Idiopathic hypertrophic subaortic stenosis   . Irregular heart beat   . Fibrocystic breast disease   . Tobacco abuse   . Hypertension   . History of colonoscopy   . Heart murmur   . Blood transfusion   . Cancer     left breast    Past Surgical History  Procedure Date  . Cardiac catheterization 01/30/2002    EF 75-80%  . US echocardiography 12/05/2008    EF 60-65%  . US echocardiography 12/01/2007    EF 60-65%  . US echocardiography 07/01/2006    EF 55-60%  . US echocardiography 06/26/2005    EF 55-60%  . Appendectomy 1946    Family History  Problem Relation Age of Onset  . Heart attack Mother   . Breast cancer Mother   . Cancer Mother     breast  . Diabetes Father   . Stomach cancer Brother   . Cancer Brother     colon    History  Substance Use Topics  . Smoking status: Current Everyday Smoker -- 1.0 packs/day  . Smokeless tobacco: Never Used  . Alcohol Use: Yes     1.5 oz  of rum in afternoon    OB History    Grav Para Term Preterm Abortions TAB SAB Ect Mult Living                  Review of Systems  HENT: Negative for neck pain.   Eyes: Negative for visual disturbance.  Gastrointestinal: Negative for nausea and vomiting.  Musculoskeletal: Negative for back pain.  Neurological: Positive for dizziness and headaches. Negative for seizures and syncope.  Psychiatric/Behavioral: Negative for confusion.  All other systems reviewed and are negative.    Allergies  Review of patient's allergies indicates no known allergies.  Home Medications   Current Outpatient Rx  Name Route Sig Dispense Refill  . ASPIRIN 81 MG PO TABS Oral Take 81 mg by mouth every other day.     Marland Kitchen CALCIUM CARBONATE-VITAMIN D 500-200 MG-UNIT PO TABS Oral Take 1 tablet by mouth every evening.    Marland Kitchen GLUCOSAMINE-CHONDROITIN 500-400 MG PO TABS Oral Take 1 tablet by mouth daily.     Marland Kitchen METOPROLOL SUCCINATE ER 100 MG PO TB24      . ONE-DAILY MULTI VITAMINS PO TABS Oral Take 1 tablet by mouth daily.        BP 187/105  Pulse 68  Temp(Src) 98.5 F (36.9 C) (Oral)  Resp 18  SpO2  96%  Physical Exam  Constitutional: She is oriented to person, place, and time. She appears well-developed and well-nourished.  HENT:  Head: Normocephalic.       And 1 cm soft tissue swelling and abrasion to occipital area  Eyes: Conjunctivae are normal. Pupils are equal, round, and reactive to light.  Neck: Normal range of motion. Neck supple.  Cardiovascular: Normal rate.   Murmur heard. Pulmonary/Chest: Effort normal. No respiratory distress.  Abdominal: She exhibits no distension.  Musculoskeletal: Normal range of motion. She exhibits no edema.        No spinal column tenderness  Neurological: She is alert and oriented to person, place, and time. No cranial nerve deficit.  Skin: Skin is warm and dry.  Psychiatric: She has a normal mood and affect. Judgment and thought content normal.    ED Course    Procedures (including critical care time) 78 year old, female taking aspirin, but no other anticoagulants.  Presents to emergency department with mild headache after a fall.  On physical examination.  She has a small contusion to her occipital region and a normal mental status and neurological examination.  There is no neck pain or evidence of the neck or spinal cord injury.  Labs Reviewed - No data to display No results found.   No diagnosis found.  Reviewed ct. No brain injury or bleed. No skull injury.  MDM  Occipital abrasion after a fall Normal.  Mental status and neurological examination without evidence of brain or skull injury.       Nicholes Stairs, MD 05/01/11 [phone]

## 2011-05-01 NOTE — ED Notes (Signed)
Patient states that she was at a store trying to reach an item on the top shelve of an aisle and fell back, hitting her head on the floor. Patient denies loss of consciousness. No pain, discomfort. Abrasion with hematoma noted at posterior aspect above the occipital region.

## 2011-05-05 ENCOUNTER — Other Ambulatory Visit: Payer: Self-pay

## 2011-05-05 DIAGNOSIS — C50919 Malignant neoplasm of unspecified site of unspecified female breast: Secondary | ICD-10-CM

## 2011-05-06 ENCOUNTER — Other Ambulatory Visit (HOSPITAL_BASED_OUTPATIENT_CLINIC_OR_DEPARTMENT_OTHER): Payer: Medicare Other | Admitting: Lab

## 2011-05-06 DIAGNOSIS — C50919 Malignant neoplasm of unspecified site of unspecified female breast: Secondary | ICD-10-CM

## 2011-05-06 LAB — CBC WITH DIFFERENTIAL/PLATELET
BASO%: 0.4 % (ref 0.0–2.0)
HCT: 43 % (ref 34.8–46.6)
MCHC: 33.9 g/dL (ref 31.5–36.0)
MONO#: 0.3 10*3/uL (ref 0.1–0.9)
RBC: 4.48 10*6/uL (ref 3.70–5.45)
RDW: 13.7 % (ref 11.2–14.5)
WBC: 5.9 10*3/uL (ref 3.9–10.3)
lymph#: 1.1 10*3/uL (ref 0.9–3.3)

## 2011-05-07 LAB — VITAMIN D 25 HYDROXY (VIT D DEFICIENCY, FRACTURES): Vit D, 25-Hydroxy: 25 ng/mL — ABNORMAL LOW (ref 30–89)

## 2011-05-07 LAB — COMPREHENSIVE METABOLIC PANEL
ALT: 17 U/L (ref 0–35)
AST: 20 U/L (ref 0–37)
CO2: 28 mEq/L (ref 19–32)
Calcium: 9.3 mg/dL (ref 8.4–10.5)
Chloride: 101 mEq/L (ref 96–112)
Potassium: 3.7 mEq/L (ref 3.5–5.3)
Sodium: 139 mEq/L (ref 135–145)
Total Protein: 6.5 g/dL (ref 6.0–8.3)

## 2011-05-13 ENCOUNTER — Ambulatory Visit: Payer: Medicare Other | Admitting: Oncology

## 2011-05-13 ENCOUNTER — Other Ambulatory Visit: Payer: Self-pay | Admitting: *Deleted

## 2011-05-14 ENCOUNTER — Ambulatory Visit (INDEPENDENT_AMBULATORY_CARE_PROVIDER_SITE_OTHER): Payer: Medicare Other | Admitting: General Surgery

## 2011-05-14 ENCOUNTER — Telehealth: Payer: Self-pay | Admitting: *Deleted

## 2011-05-14 ENCOUNTER — Encounter (INDEPENDENT_AMBULATORY_CARE_PROVIDER_SITE_OTHER): Payer: Self-pay | Admitting: General Surgery

## 2011-05-14 ENCOUNTER — Other Ambulatory Visit (INDEPENDENT_AMBULATORY_CARE_PROVIDER_SITE_OTHER): Payer: Self-pay

## 2011-05-14 VITALS — BP 124/76 | HR 70 | Temp 97.8°F | Resp 18 | Ht 65.0 in | Wt 122.8 lb

## 2011-05-14 DIAGNOSIS — C50419 Malignant neoplasm of upper-outer quadrant of unspecified female breast: Secondary | ICD-10-CM | POA: Insufficient documentation

## 2011-05-14 DIAGNOSIS — C50912 Malignant neoplasm of unspecified site of left female breast: Secondary | ICD-10-CM

## 2011-05-14 DIAGNOSIS — C50919 Malignant neoplasm of unspecified site of unspecified female breast: Secondary | ICD-10-CM

## 2011-05-14 NOTE — Telephone Encounter (Signed)
left voice message to inform the patient of the new date and time for 07-04-2011 starting at 3:00pm

## 2011-05-14 NOTE — Progress Notes (Signed)
Subjective:     Patient ID: Tricia [name], female   DOB: 1925-05-25, 78 y.o.   MRN: [id number]  HPI  Tricia [name] is a 78 y.o. female. She returned with her husband and one of her sons regarding her left breast cancer, which is being treated in the neoadjuvant setting with Femara. This patient was initially seen in the breast clinic on November 13, 2010 by Dr. Donnie Coffin, Dr. Basilio Cairo, and me. She has suspected dementia and has poor memory. We had difficulty explaining her situation to her.  She has multicentric cancer of the left breast. There is invasive ductal carcinoma in the upper inner quadrant of the left breast, and a palpable invasive carcinoma in the upper outer quadrant of the left breast. These are receptor positive, HER-2/neu negative. The initial treatment plan was to treat this in the neoadjuvant setting with Femara in hopes of downstaging of tumor and be able to do 2 lumpectomies. She was given the alternative of mastectomy and did not like that idea. Her level of insight and understanding is under question.   She last saw Dr. Donnie Coffin on February 10, 2011. He scheduled her for a left breast ultrasound which was canceled for some reason. She is here today with her husband and a son from [place].  The patient and her husband have poor insight, but the son has good insight and is motivated to see that they follow through on her treatment plans. The husband states that he knows she has breast cancer.  .  In any event, she states she is taking a Femara and has no new complaints about her breast. She does not know that she was supposed to get an ultrasound, and that has never been done.  The patient states that she thinks the cancer in her left breast is smaller. She denies pain. She does not know when her next office visit with Dr. Donnie Coffin is. She has seen Dr. [name] within the last 2 months, who follows her for cardiac disease.  She was supposed to see Dr. Timothy Lasso for a general medical  evaluation, but we cannot confirm that that has ever been done.   Past Medical History  Diagnosis Date  . Idiopathic hypertrophic subaortic stenosis   . Irregular heart beat   . Fibrocystic breast disease   . Tobacco abuse   . Hypertension   . History of colonoscopy   . Heart murmur   . Blood transfusion   . Cancer     left breast  \ No medication comments found. No Known Allergies  Review of Systems 10 system review of systems is performed and is negative except as described above.    Objective:   Physical Exam  Constitutional: She appears well-developed and well-nourished. No distress.  Cardiovascular: Normal rate and regular rhythm.   Murmur heard.      Systolic murmur  Pulmonary/Chest: Effort normal and breath sounds normal. No respiratory distress. She has no wheezes.    Abdominal: Soft. Bowel sounds are normal. She exhibits no distension.  Skin: Skin is warm and dry. No rash noted. She is not diaphoretic. No erythema. No pallor.  Psychiatric:       She is alert and answers questions  reasonably well. She has poor memory and poor insight. Speech is normal.       Assessment:     Multifocal, invasive carcinoma left breast, palpable T2 tumor upper outer quadrant, nonpalpable tumor upper inner quadrant, both biopsy proven, both receptor positive,  both HER-2 negative.  Currently being treated in the neoadjuvant setting with Femara. She needs objective assessment as to whether she has responded.  Dementia  Cardiac disease, followed by [name]    Plan:     I had a very lengthy discussion with the patient, her husband, and her son.  She will continue neoadjuvant Femara  I will schedule her for bilateral breast MRI and left breast ultrasound  She has an appointment with Dr. Donnie Coffin on April 5  Return to see me in one month  We talked about definitive surgical therapy. The choices here are going to be 2 separate needle localized lumpectomies  versus a  mastectomy. I told him I would discuss with Dr. Donnie Coffin as to whether sentinel node biopsy would be of benefit. I am not sure that it will be.  She will eventually need cardiac clearance prior to any definitive surgical therapy.   Angelia Mould. Derrell Lolling, M.D., [facility] General and Minimally invasive Surgery Breast and Colorectal Surgery Office:   [phone] Pager:   [phone]

## 2011-05-14 NOTE — Patient Instructions (Signed)
I cannot tell whether the breast cancer in the left breast laterally has gotten smaller or not. You will be scheduled for bilateral breast MRI with a left breast ultrasound to see.  You will return to see me in one month.  Eventually, we are going to need to do some type of breast surgery. This will either be 2 separate lumpectomies or a mastectomy. We have not decided whether we are going to sample the lymph nodes or not. I will discuss this with Dr. Donnie Coffin.  Your next office visit with Dr. Donnie Coffin is April 5.

## 2011-05-22 ENCOUNTER — Inpatient Hospital Stay: Admission: RE | Admit: 2011-05-22 | Payer: Medicare Other | Source: Ambulatory Visit

## 2011-05-27 ENCOUNTER — Ambulatory Visit
Admission: RE | Admit: 2011-05-27 | Discharge: 2011-05-27 | Disposition: A | Discharge status: Home / Self Care | Payer: Medicare Other | Source: Ambulatory Visit | Attending: General Surgery | Admitting: General Surgery

## 2011-05-27 DIAGNOSIS — C50912 Malignant neoplasm of unspecified site of left female breast: Secondary | ICD-10-CM

## 2011-05-27 MED ORDER — GADOBENATE DIMEGLUMINE 529 MG/ML IV SOLN
9.0000 mL | Freq: Once | INTRAVENOUS | Status: AC | PRN
  Administered 2011-05-27: 9 mL via INTRAVENOUS

## 2011-06-26 ENCOUNTER — Ambulatory Visit (INDEPENDENT_AMBULATORY_CARE_PROVIDER_SITE_OTHER): Payer: Medicare Other | Admitting: General Surgery

## 2011-06-26 ENCOUNTER — Telehealth (INDEPENDENT_AMBULATORY_CARE_PROVIDER_SITE_OTHER): Payer: Self-pay

## 2011-06-26 NOTE — Telephone Encounter (Signed)
Pt's son returned call from Dr. Jacinto Halim nurse.  He is currently out of the country, but will call back early next week to reschedule his mother's appointment.

## 2011-06-26 NOTE — Telephone Encounter (Signed)
Pt did not show up for appt today. I have left msg for pts son that pt did not keep appt today and importance of r/s appt and f/u. I am awaiting call back.

## 2011-07-01 ENCOUNTER — Encounter (INDEPENDENT_AMBULATORY_CARE_PROVIDER_SITE_OTHER): Payer: Self-pay | Admitting: General Surgery

## 2011-07-03 ENCOUNTER — Telehealth (INDEPENDENT_AMBULATORY_CARE_PROVIDER_SITE_OTHER): Payer: Self-pay

## 2011-07-03 NOTE — Telephone Encounter (Signed)
Pts son advised of f/u date with Dr Derrell Lolling in May 2013. He states pt pt has appt with Dr Donnie Coffin 4-6. I advised him to confirm with Dr Donnie Coffin that waiting for f/u in May is appropriate. He states he will call to make sooner appt if indicated.

## 2011-07-04 ENCOUNTER — Other Ambulatory Visit: Payer: Medicare Other | Admitting: Lab

## 2011-07-04 ENCOUNTER — Ambulatory Visit (HOSPITAL_BASED_OUTPATIENT_CLINIC_OR_DEPARTMENT_OTHER): Payer: Medicare Other | Admitting: Oncology

## 2011-07-04 VITALS — BP 150/77 | HR 74 | Temp 98.5°F | Ht 65.0 in | Wt 122.7 lb

## 2011-07-04 DIAGNOSIS — C50419 Malignant neoplasm of upper-outer quadrant of unspecified female breast: Secondary | ICD-10-CM

## 2011-07-04 DIAGNOSIS — E559 Vitamin D deficiency, unspecified: Secondary | ICD-10-CM

## 2011-07-04 DIAGNOSIS — Z170 Estrogen receptor positive status [ER+]: Secondary | ICD-10-CM

## 2011-07-04 DIAGNOSIS — C50919 Malignant neoplasm of unspecified site of unspecified female breast: Secondary | ICD-10-CM

## 2011-07-07 ENCOUNTER — Telehealth: Payer: Self-pay | Admitting: *Deleted

## 2011-07-07 NOTE — Telephone Encounter (Signed)
GAVE PATIENT APPOINTMENT FOR TWO MONTHS printed out calendar and gave to the patient

## 2011-07-16 ENCOUNTER — Telehealth (INDEPENDENT_AMBULATORY_CARE_PROVIDER_SITE_OTHER): Payer: Self-pay

## 2011-07-16 NOTE — Telephone Encounter (Signed)
Pt's son called to confirm f/u plan for pt. Son states pt has appt with Dr Donnie Coffin 6-18 to discuss response to pre op therapy. He wants to know should we move the f/u appt with Dr Derrell Lolling to after this date instead of in May. I advised him I would review this with Dr Derrell Lolling and call him with response.

## 2011-07-21 ENCOUNTER — Telehealth (INDEPENDENT_AMBULATORY_CARE_PROVIDER_SITE_OTHER): Payer: Self-pay

## 2011-07-21 NOTE — Progress Notes (Signed)
Hematology and Oncology Follow Up Visit  Tricia [name] [id number] 1926/01/04 78 y.o. 07/21/2011 10:57 AM PCPDr Derrell Lolling   Principle Diagnosis: T70-year-old with a history of locally advanced ER PR positive breast cancer on Femara since August 2012.  Interim History:  There have been no intercurrent illness, hospitalizations or medication changes. She is doing wI have reviewed the patient's current medications.s the mass in her left breast is somewhat smaller Medications: I have reviewed the patient's current medications.  Allergies: No Known Allergies  Past Medical History, Surgical history, Social history, and Family History were reviewed and updated.  Review of Systems: Constitutional:  Negative for fever, chills, night sweats, anorexia, weight loss, pain. Cardiovascular: no chest pain or dyspnea on exertion Respiratory: no cough, shortness of breath, or wheezing Neurological: no TIA or stroke symptoms Dermatological: negative ENT: negative Skin Gastrointestinal: no abdominal pain, change in bowel habits, or black or bloody stools Genito-Urinary: negative Hematological and Lymphatic: negative Breast: negative Musculoskeletal: negative Remaining ROS negative.  Physical Exam: Blood pressure 150/77, pulse 74, temperature 98.5 F (36.9 C), temperature source Oral, height 5\' 5"  (1.651 m), weight 122 lb 11.2 oz (55.656 kg). ECOG: 1 General appearance: alert, cooperative and appears stated age Head: Normocephalic, without obvious abnormality, atraumatic Neck: no adenopathy, no carotid bruit, no JVD, supple, symmetrical, trachea midline and thyroid not enlarged, symmetric, no tenderness/mass/nodules Lymph nodes: Cervical, supraclavicular, and axillary nodes normal. Cardiac : regular rate and rhythm, no murmurs or gallops Pulmonary:clear to auscultation bilaterally and normal percussion bilaterally Breasts: inspection negative, no nipple discharge or bleeding, no masses or nodularity  palpable and The left breast has a palpable mass in the upper-outer quadrant and a smaller than on previous exam is difficult to really assess exact dimensions. There is thickening in the general area so it makes a careful assessment more challenging. Abdomen:soft, non-tender; bowel sounds normal; no masses,  no organomegaly Extremities negative Neuro: alert, oriented, normal speech, no focal findings or movement disorder noted  Lab Results: Lab Results  Component Value Date   WBC 5.9 05/06/2011   HGB 14.6 05/06/2011   HCT 43.0 05/06/2011   MCV 96.1 05/06/2011   PLT 152 05/06/2011     Chemistry      Component Value Date/Time   NA 139 05/06/2011 1007   K 3.7 05/06/2011 1007   CL 101 05/06/2011 1007   CO2 28 05/06/2011 1007   BUN 18 05/06/2011 1007   CREATININE 0.75 05/06/2011 1007      Component Value Date/Time   CALCIUM 9.3 05/06/2011 1007   ALKPHOS 18* 05/06/2011 1007   AST 20 05/06/2011 1007   ALT 17 05/06/2011 1007   BILITOT 0.6 05/06/2011 1007      .pathology. Radiological Studies: chest X-ray  Mammogram/MRI Findings: The enhancing mass located within the left upper-outer  quadrant (two o'clock position) has decreased in size and the  degree of enhancement has decreased when compared to the prior  study. The mass now measures 5 x 5 x 5 mm in size and previously  measured 8 x 8 x 13 mm in size. There is marked decrease in the  enhancement of the medially located left breast mass (nine o'clock  position) which is no longer visible on the subtracted views. On  the precontrast views, the mass remains visible and measures 6 x 6  x 5 mm in size. There are no new enhancing foci within either  breast. There is no evidence for internal mammary or axillary  adenopathy and no metastatic  foci are present. There are no  additional findings.  IMPRESSION:  Interval decrease in the size and enhancement of the left breast  masses as discussed above. Otherwise, no significant change.  Bone density 8/12 DUAL  X-RAY ABSORPTIOMETRY (DXA) FOR BONE MINERAL DENSITY  LEFT FEMUR (NECK)  Bone Mineral Density (BMD): 0.567 g/cm2  Young Adult T Score: -2.5  Z Score: -0.1  LEFT FOREARM (1/3 RADIUS)  Bone Mineral Density (BMD): 0.550 g/cm2  Young Adult T Score: -2.4  Z Score: 1.1   Impression and Plan: Patient is doing well. She is tolerating Femara well. She's been on this for approximately am 8 months. There's been a good response. Imaging done in February confirms that the masses in the breast are still in a smaller than it were. It would not be unreasonable to consider her for surgery now wall currently looks fairly continue treated with Femara as I suspect that she may have an ongoing response to treatment. I will plan to see her back in 6-8 weeks.  More than 50% of the visit was spent in patient-related counselling   Pierce Crane, MD 4/22/201310:57 AM

## 2011-07-21 NOTE — Telephone Encounter (Signed)
Message copied by Joanette Gula on Mon Jul 21, 2011  9:22 AM ------      Message from: Ernestene Mention      Created: Sun Jul 20, 2011 10:33 PM       Arline Asp,            In response to the son's inquiry about timing of office visits with me, I can see her in June instead of May,  after she sees Dr. Donnie Coffin on June 18.  Probably no downside nor advantage to waiting until then.             Looking at Epic, I cannot document that she has seen Dr. Donnie Coffin since Nov., 2012,  She does have dementia and may have missed an appointment. I think it was Dr.Rubin's intent to see sooner.            I can proceed with definitive surgery whenever Dr. Donnie Coffin feels he has achieved all he can with neoadjuvant Femara. Will forward this note to him as well.            Claud Kelp

## 2011-07-22 ENCOUNTER — Telehealth (INDEPENDENT_AMBULATORY_CARE_PROVIDER_SITE_OTHER): Payer: Self-pay

## 2011-07-22 NOTE — Telephone Encounter (Signed)
Per Son's request pt f/u appt moved to 6-24 to discuss treatment plan. I stressed to him importance of keeping appt with Dr Donnie Coffin and Dr Derrell Lolling.

## 2011-08-11 ENCOUNTER — Ambulatory Visit (INDEPENDENT_AMBULATORY_CARE_PROVIDER_SITE_OTHER): Payer: Medicare Other | Admitting: General Surgery

## 2011-09-01 ENCOUNTER — Encounter: Payer: Self-pay | Admitting: Cardiology

## 2011-09-01 ENCOUNTER — Ambulatory Visit (INDEPENDENT_AMBULATORY_CARE_PROVIDER_SITE_OTHER): Payer: Medicare Other | Admitting: Cardiology

## 2011-09-01 VITALS — BP 112/74 | HR 63 | Ht 65.0 in | Wt 119.0 lb

## 2011-09-01 DIAGNOSIS — C50419 Malignant neoplasm of upper-outer quadrant of unspecified female breast: Secondary | ICD-10-CM

## 2011-09-01 DIAGNOSIS — I119 Hypertensive heart disease without heart failure: Secondary | ICD-10-CM

## 2011-09-01 DIAGNOSIS — F17200 Nicotine dependence, unspecified, uncomplicated: Secondary | ICD-10-CM

## 2011-09-01 DIAGNOSIS — Z716 Tobacco abuse counseling: Secondary | ICD-10-CM

## 2011-09-01 DIAGNOSIS — I421 Obstructive hypertrophic cardiomyopathy: Secondary | ICD-10-CM

## 2011-09-01 DIAGNOSIS — Z7189 Other specified counseling: Secondary | ICD-10-CM

## 2011-09-01 NOTE — Assessment & Plan Note (Signed)
She is being followed for this by Dr. Derrell Lolling.  She presently is on Cl[place]ara and is awaiting surgery on her left breast

## 2011-09-01 NOTE — Patient Instructions (Signed)
Your physician wants you to follow-up in: 6 MONTHS.  You will receive a reminder letter in the mail two months in advance. If you don't receive a letter, please call our office to schedule the follow-up appointment.  Your physician recommends that you continue on your current medications as directed. Please refer to the Current Medication list given to you today.  

## 2011-09-01 NOTE — Assessment & Plan Note (Signed)
Patient has a past history of IHSS.  She denies any exertional chest pain or symptoms of congestive heart failure.  She's had no dizziness or syncope.  She's had no awareness of any racing of her heart.  She is tolerating metoprolol XL 100 mg daily

## 2011-09-01 NOTE — Assessment & Plan Note (Signed)
The patient continues to smoke about a pack a day.  She does have a dry nonproductive cough.  We talked to her again today about the importance of stopping smoking.  She does not appear to be inclined to want to stop at this point

## 2011-09-01 NOTE — Progress Notes (Signed)
Tricia [name] Date of Birth:  05-24-1925 [facility] [address] [place], [place]  [address] [phone]  Fax   [phone]  HPI: This pleasant 78 year old woman is seen for a six-month followup office visit.  She has a past history of IHSS and a history of essential hypertension.  She has ongoing tobacco abuse and continues to smoke one pack of cigarettes a day.  Since last visit she's had no new cardiac symptoms.  Current Outpatient Prescriptions  Medication Sig Dispense Refill  . aspirin 81 MG tablet Take 81 mg by mouth every other day.       . calcium-vitamin D (OSCAL WITH D) 500-200 MG-UNIT per tablet Take 1 tablet by mouth every evening.      Marland Kitchen glucosamine-chondroitin 500-400 MG tablet Take 1 tablet by mouth daily.       Marland Kitchen letrozole (FEMARA) 2.5 MG tablet Take 1 tablet by mouth Daily.      . metoprolol succinate (TOPROL XL) 100 MG 24 hr tablet Take 100 mg by mouth daily.       . Multiple Vitamin (MULTIVITAMIN) tablet Take 1 tablet by mouth daily.        Marland Kitchen donepezil (ARICEPT) 5 MG tablet         No Known Allergies  Patient Active Problem List  Diagnoses  . IHSS (idiopathic hypertrophic subaortic stenosis)  . Tobacco abuse counseling  . Benign hypertensive heart disease without heart failure  . Cancer of upper-outer quadrant of female breast    History  Smoking status  . Current Everyday Smoker -- 1.0 packs/day  Smokeless tobacco  . Never Used    History  Alcohol Use  . Yes    1.5 oz of rum in afternoon    Family History  Problem Relation Age of Onset  . Heart attack Mother   . Breast cancer Mother   . Cancer Mother     breast  . Diabetes Father   . Stomach cancer Brother   . Cancer Brother     colon    Review of Systems: The patient denies any heat or cold intolerance.  No weight gain or weight loss.  The patient denies headaches or blurry vision.  There is no cough or sputum production.  The patient denies dizziness.  There  is no hematuria or hematochezia.  The patient denies any muscle aches or arthritis.  The patient denies any rash.  The patient denies frequent falling or instability.  There is no history of depression or anxiety.  All other systems were reviewed and are negative.   Physical Exam: Filed Vitals:   09/01/11 1217  BP: 112/74  Pulse: 63   the general appearance reveals a well-developed well-nourished woman in no distress.  Chest reveals scattered rhonchi.  Heart reveals grade 3/6 harsh systolic ejection murmur at the left sternal edge.  No diastolic murmur.  Rhythm is regular.  No gallop or rub. The abdomen is soft and nontender. Bowel sounds are normal. The liver and spleen are not enlarged. There Are no abdominal masses. There are no bruits.  The pedal pulses are good.  There is no phlebitis or edema.  There is no cyanosis or clubbing. Strength is normal and symmetrical in all extremities.  There is no lateralizing weakness.  There are no sensory deficits.  The skin is warm and dry.  There is no rash.  EKG shows normal sinus rhythm possible left atrial enlargement left axis deviation and LVH with strain  Assessment / Plan: Continue same medication.  I strongly urged her to quit smoking.  Recheck in 6 months for office visit

## 2011-09-09 NOTE — Progress Notes (Signed)
Addended by: Reine Just on: 09/09/2011 05:07 PM   Modules accepted: Orders

## 2011-09-16 ENCOUNTER — Ambulatory Visit (HOSPITAL_BASED_OUTPATIENT_CLINIC_OR_DEPARTMENT_OTHER): Payer: Medicare Other | Admitting: Oncology

## 2011-09-16 ENCOUNTER — Other Ambulatory Visit (HOSPITAL_BASED_OUTPATIENT_CLINIC_OR_DEPARTMENT_OTHER): Payer: Medicare Other | Admitting: Lab

## 2011-09-16 ENCOUNTER — Telehealth: Payer: Self-pay | Admitting: *Deleted

## 2011-09-16 VITALS — BP 160/85 | HR 59 | Temp 98.2°F | Ht 65.0 in | Wt 119.5 lb

## 2011-09-16 DIAGNOSIS — E559 Vitamin D deficiency, unspecified: Secondary | ICD-10-CM

## 2011-09-16 DIAGNOSIS — C50919 Malignant neoplasm of unspecified site of unspecified female breast: Secondary | ICD-10-CM

## 2011-09-16 DIAGNOSIS — C50419 Malignant neoplasm of upper-outer quadrant of unspecified female breast: Secondary | ICD-10-CM

## 2011-09-16 LAB — CBC WITH DIFFERENTIAL/PLATELET
Basophils Absolute: 0 10*3/uL (ref 0.0–0.1)
EOS%: 1.4 % (ref 0.0–7.0)
LYMPH%: 18.2 % (ref 14.0–49.7)
MCH: 32.4 pg (ref 25.1–34.0)
MCV: 96.1 fL (ref 79.5–101.0)
MONO%: 6.5 % (ref 0.0–14.0)
Platelets: 155 10*3/uL (ref 145–400)
RBC: 4.38 10*6/uL (ref 3.70–5.45)
RDW: 13.6 % (ref 11.2–14.5)

## 2011-09-16 MED ORDER — ALENDRONATE SODIUM 70 MG PO TABS: 70.0000 mg | ORAL_TABLET | ORAL | Status: DC

## 2011-09-16 NOTE — Progress Notes (Signed)
Hematology and Oncology Follow Up Visit  Tricia [name] [id number] Oct 09, 1925 78 y.o. 09/16/2011 11:20 AM PCPDr Derrell Lolling   Principle Diagnosis: T14-year-old with a history of locally advanced ER PR positive breast cancer on Femara since August 2012.  Interim History:  There have been no intercurrent illness, hospitalizations or medication changes. She is doing wI have reviewed the patient's current medications.s the mass in her left breast is somewhat smaller Medications: I have reviewed the patient's current medications.  Allergies: No Known Allergies  Past Medical History, Surgical history, Social history, and Family History were reviewed and updated.  Review of Systems: Constitutional:  Negative for fever, chills, night sweats, anorexia, weight loss, pain. Cardiovascular: no chest pain or dyspnea on exertion Respiratory: no cough, shortness of breath, or wheezing Neurological: no TIA or stroke symptoms Dermatological: negative ENT: negative Skin Gastrointestinal: no abdominal pain, change in bowel habits, or black or bloody stools Genito-Urinary: negative Hematological and Lymphatic: negative Breast: negative Musculoskeletal: negative Remaining ROS negative.  Physical Exam: There were no vitals taken for this visit. ECOG: 1 General appearance: alert, cooperative and appears stated age Head: Normocephalic, without obvious abnormality, atraumatic Neck: no adenopathy, no carotid bruit, no JVD, supple, symmetrical, trachea midline and thyroid not enlarged, symmetric, no tenderness/mass/nodules Lymph nodes: Cervical, supraclavicular, and axillary nodes normal. Cardiac : regular rate and rhythm, no murmurs or gallops Pulmonary:clear to auscultation bilaterally and normal percussion bilaterally Breasts: inspection negative, no nipple discharge or bleeding, no masses or nodularity palpable and The left breast has a palpable mass in the upper-outer quadrant and a smaller than on  previous exam is difficult to really assess exact dimensions. There is thickening in the general area so it makes a careful assessment more challenging. Abdomen:soft, non-tender; bowel sounds normal; no masses,  no organomegaly Extremities negative Neuro: alert, oriented, normal speech, no focal findings or movement disorder noted  Lab Results: Lab Results  Component Value Date   WBC 5.9 05/06/2011   HGB 14.6 05/06/2011   HCT 43.0 05/06/2011   MCV 96.1 05/06/2011   PLT 152 05/06/2011     Chemistry      Component Value Date/Time   NA 139 05/06/2011 1007   K 3.7 05/06/2011 1007   CL 101 05/06/2011 1007   CO2 28 05/06/2011 1007   BUN 18 05/06/2011 1007   CREATININE 0.75 05/06/2011 1007      Component Value Date/Time   CALCIUM 9.3 05/06/2011 1007   ALKPHOS 18* 05/06/2011 1007   AST 20 05/06/2011 1007   ALT 17 05/06/2011 1007   BILITOT 0.6 05/06/2011 1007      .pathology. Radiological Studies: chest X-ray  Mammogram/MRI Findings: The enhancing mass located within the left upper-outer  quadrant (two o'clock position) has decreased in size and the  degree of enhancement has decreased when compared to the prior  study. The mass now measures 5 x 5 x 5 mm in size and previously  measured 8 x 8 x 13 mm in size. There is marked decrease in the  enhancement of the medially located left breast mass (nine o'clock  position) which is no longer visible on the subtracted views. On  the precontrast views, the mass remains visible and measures 6 x 6  x 5 mm in size. There are no new enhancing foci within either  breast. There is no evidence for internal mammary or axillary  adenopathy and no metastatic foci are present. There are no  additional findings.  IMPRESSION:  Interval decrease in the size  and enhancement of the left breast  masses as discussed above. Otherwise, no significant change.  Bone density 8/12 DUAL X-RAY ABSORPTIOMETRY (DXA) FOR BONE MINERAL DENSITY  LEFT FEMUR (NECK)  Bone Mineral Density  (BMD): 0.567 g/cm2  Young Adult T Score: -2.5  Z Score: -0.1  LEFT FOREARM (1/3 RADIUS)  Bone Mineral Density (BMD): 0.550 g/cm2  Young Adult T Score: -2.4  Z Score: 1.1   Impression and Plan: Patient is doing well. She is tolerating Femara well. She's been on this for approximately am 8 months. There's been a good response. Imaging done in February confirms that the masses in the breast are still in a smaller than it were. It would not be unreasonable to consider her for surgery now wall currently looks fairly continue treated with Femara as I suspect that she may have an ongoing response to treatment. I will plan to see her back in 12  Weeks.of note i staed her on fosamax and told her to increase her vitamin D3.     More than 50% of the visit was spent in patient-related counselling   Pierce Crane, MD 6/18/201311:20 AM

## 2011-09-16 NOTE — Telephone Encounter (Signed)
Gave patient appointment for 12-18-2011 starting at 9:00am

## 2011-09-17 LAB — COMPREHENSIVE METABOLIC PANEL
AST: 18 U/L (ref 0–37)
Albumin: 4.1 g/dL (ref 3.5–5.2)
Alkaline Phosphatase: 35 U/L — ABNORMAL LOW (ref 39–117)
BUN: 18 mg/dL (ref 6–23)
Glucose, Bld: 133 mg/dL — ABNORMAL HIGH (ref 70–99)
Potassium: 4.5 mEq/L (ref 3.5–5.3)
Sodium: 136 mEq/L (ref 135–145)
Total Bilirubin: 0.6 mg/dL (ref 0.3–1.2)

## 2011-09-21 ENCOUNTER — Encounter: Payer: Self-pay | Admitting: Oncology

## 2011-09-22 ENCOUNTER — Telehealth: Payer: Self-pay | Admitting: Cardiology

## 2011-09-22 ENCOUNTER — Ambulatory Visit (INDEPENDENT_AMBULATORY_CARE_PROVIDER_SITE_OTHER): Payer: Medicare Other | Admitting: General Surgery

## 2011-09-22 ENCOUNTER — Encounter (INDEPENDENT_AMBULATORY_CARE_PROVIDER_SITE_OTHER): Payer: Self-pay | Admitting: General Surgery

## 2011-09-22 VITALS — BP 112/74 | HR 60 | Temp 97.8°F | Resp 16 | Ht 65.0 in | Wt 118.8 lb

## 2011-09-22 DIAGNOSIS — C50419 Malignant neoplasm of upper-outer quadrant of unspecified female breast: Secondary | ICD-10-CM

## 2011-09-22 NOTE — Telephone Encounter (Signed)
Patient is okay for surgery from a cardiology standpoint.  She does have IHSS and so it will be important to maintain careful fluid balance intraoperatively and avoid hypovolemia.

## 2011-09-22 NOTE — Progress Notes (Signed)
Patient ID: Tricia [name], female   DOB: February 07, 1926, 78 y.o.   MRN: [id number]  Chief Complaint  Patient presents with  . Follow-up    discuss br cancer treatment    HPI Tricia [name] is a 78 y.o. female.  She returns with her husband and son to discuss management  of her multifocal left breast cancer.  This patient was initially seen in the breast clinic on 11/13/18 Dr. Donnie Coffin, Dr. Basilio Cairo, and me. She has some  form of undefined dementia and has poor memory. We had difficulty explaining her situation to her.  She has a palpable biopsy-proven invasive carcinoma in the 2:30 position the left breast and a biopsy proven invasive carcinoma at the 9 oclock position of the left breast. Both of these are receptor positive, HER-2-negative.The initial treatment plan decided was treated this with Femara and then to down stage the tumor. Initially she was given the option of mastectomy and did not like that idea. She was considering to separate lumpectomies.  She has recently seen Dr. Donnie Coffin. He thinks that it is reasonable to proceed with surgical intervention.  MRI performed on 05/27/11 shows that the cancer at the 2:30  position has gone from 13 m to 5 mm and is not enhancing as much. The second cancer in the left breast medially at 9:00 is also smaller measuring 6 mm. There is no adenopathy. There is no contralateral disease.  She is actively followed by Dr. Patty Sermons and by Dr. Timothy Lasso. HPI  Past Medical History  Diagnosis Date  . Idiopathic hypertrophic subaortic stenosis   . Irregular heart beat   . Fibrocystic breast disease   . Tobacco abuse   . Hypertension   . History of colonoscopy   . Heart murmur   . Blood transfusion   . Cancer     left breast    Past Surgical History  Procedure Date  . Cardiac catheterization 01/30/2002    EF 75-80%  . US echocardiography 12/05/2008    EF 60-65%  . US echocardiography 12/01/2007    EF 60-65%  . US echocardiography 07/01/2006    EF 55-60%  .  US echocardiography 06/26/2005    EF 55-60%  . Appendectomy 1946    Family History  Problem Relation Age of Onset  . Heart attack Mother   . Breast cancer Mother   . Cancer Mother     breast  . Diabetes Father   . Stomach cancer Brother   . Cancer Brother     colon    Social History History  Substance Use Topics  . Smoking status: Current Everyday Smoker -- 1.0 packs/day  . Smokeless tobacco: Never Used  . Alcohol Use: Yes     1.5 oz of rum in afternoon    No Known Allergies  Current Outpatient Prescriptions  Medication Sig Dispense Refill  . alendronate (FOSAMAX) 70 MG tablet Take 1 tablet (70 mg total) by mouth every 7 (seven) days. Take with a full glass of water on an empty stomach.  12 tablet  4  . aspirin 81 MG tablet Take 81 mg by mouth every other day.       . calcium-vitamin D (OSCAL WITH D) 500-200 MG-UNIT per tablet Take 1 tablet by mouth every evening.      . donepezil (ARICEPT) 5 MG tablet       . glucosamine-chondroitin 500-400 MG tablet Take 1 tablet by mouth daily.       Marland Kitchen letrozole [facility]) 2.5  MG tablet Take 1 tablet by mouth Daily.      . metoprolol succinate (TOPROL XL) 100 MG 24 hr tablet Take 100 mg by mouth daily.       . Multiple Vitamin (MULTIVITAMIN) tablet Take 1 tablet by mouth daily.          Review of Systems Review of Systems  Constitutional: Negative for fever, chills and unexpected weight change.  HENT: Negative for hearing loss, congestion, sore throat, trouble swallowing and voice change.   Eyes: Negative for visual disturbance.  Respiratory: Positive for apnea. Negative for cough and wheezing.   Cardiovascular: Negative for chest pain, palpitations and leg swelling.  Gastrointestinal: Negative for nausea, vomiting, abdominal pain, diarrhea, constipation, blood in stool, abdominal distention and anal bleeding.  Genitourinary: Negative for hematuria, vaginal bleeding and difficulty urinating.  Musculoskeletal: Negative for arthralgias.   Skin: Negative for rash and wound.  Neurological: Negative for seizures, syncope and headaches.  Hematological: Negative for adenopathy. Does not bruise/bleed easily.  Psychiatric/Behavioral: Positive for confusion. The patient is nervous/anxious.     Blood pressure 112/74, pulse 60, temperature 97.8 F (36.6 C), temperature source Temporal, resp. rate 16, height 5\' 5"  (1.651 m), weight 118 lb 12.8 oz (53.887 kg).  Physical Exam Physical Exam  Constitutional: She is oriented to person, place, and time. She appears well-developed and well-nourished. No distress.  HENT:  Head: Normocephalic and atraumatic.  Nose: Nose normal.  Mouth/Throat: No oropharyngeal exudate.  Eyes: Conjunctivae and EOM are normal. Pupils are equal, round, and reactive to light. Left eye exhibits no discharge. No scleral icterus.  Neck: Neck supple. No JVD present. No tracheal deviation present. No thyromegaly present.  Cardiovascular: Normal rate, regular rhythm and intact distal pulses.   Murmur heard.      Harsh systolic murmur. Has IHSS.  Pulmonary/Chest: Effort normal and breath sounds normal. No respiratory distress. She has no wheezes. She has no rales. She exhibits no tenderness.       2.5 cm palpable mass left breast at 2:30 position. No other masses or skin changes. Breasts atrophic.No axillary nodes.  Abdominal: Soft. Bowel sounds are normal. She exhibits no distension and no mass. There is no tenderness. There is no rebound and no guarding.       Small, nontender umbilical hernia  Musculoskeletal: She exhibits no edema and no tenderness.  Lymphadenopathy:    She has no cervical adenopathy.  Neurological: She is alert and oriented to person, place, and time. She exhibits normal muscle tone. Coordination normal.  Skin: Skin is warm. No rash noted. She is not diaphoretic. No erythema. No pallor.  Psychiatric: She has a normal mood and affect. Her behavior is normal. Judgment and thought content normal.      Data Reviewed Dr. Yevonne Pax notes and Dr. Renelda Loma notes. MRI.  Assessment    Multifocal invasive cancer left breast, 2:30 position and 9:00 position. Responding to Femara by MRI, but still palpable laterally.  I agree that it is time to offer definitive surgical care options to the patient, her husband and her son, and we did this today.  IHSS.  Dementia  Hypertension  Ongoing tobacco abuse.    Plan    We had a very, very long discussion. The son asked to discuss this with me in private, and then we had discussed things together. We talked about left mastectomy versus double needle locallized left lumpectomies. I told them that I thought that  mastectomy was essentially the same in terms of its  invasiveness and disability with mastectomy, and that mastectomy would avoid the need for radiation therapy, theoretically.  We talked about doing nothing other than taking the Femara. We talked about delaying a decision. . At the end of the discussion the patient seems to want to go ahead with a left simple mastectomy. I do not think there is any value to lymph node dissection in this case. She is not a candidate for cytotoxic chemotherapy.  We will ask cardiac clearance with Dr. [name]. She just saw him recently.  She was strongly urged to stop smoking  I discussed the indications, details, techniques, and numerous risks of mastectomy, and  the recovery postop with temporary disability and in- home care and issues such as that. They understand these issues. There questions were answered. They agree with this plan.       Angelia Mould. Derrell Lolling, M.D., [facility] General and Minimally invasive Surgery Breast and Colorectal Surgery Office:   [phone] Pager:   [phone]  09/22/2011, 11:56 AM

## 2011-09-22 NOTE — Telephone Encounter (Signed)
Will fax on Wednesday after signed, advised Annabelle Harman

## 2011-09-22 NOTE — Patient Instructions (Signed)
You have responded somewhat to the Femara medication. The 2 cancers in your left breast  looked smaller by MRI  than they were originally.By physical exam the cancer in the left breast at the 2:30 position is just as large as it was originally. We talked about several options for intervention. I have advised you that it is reasonable to proceed with surgical intervention at this point in time.  We are going to ask Dr. Patty Sermons  for cardiac clearance for you to undergo a left simple mastectomy.  Eventually you will let me know if you are in agreement with this plan and then we will proceed with the surgery.  If you decline to have surgery then I recommend that you stay on the Femara medication and followup with Dr. Donnie Coffin in the near future.    Total or Modified Radical Mastectomy  Care After Refer to this sheet in the next few weeks. These instructions provide you with information on caring for yourself after your procedure. Your caregiver may also give you more specific instructions. Your treatment has been planned according to current medical practices, but problems sometimes occur. Call your caregiver if you have any problems or questions after your procedure. ACTIVITY  Your caregiver will advise you when you may resume strenuous activities, driving, and sports.   After the drain(s) are removed, you may do light housework. Avoid heavy lifting, carrying, or pushing. You should not be lifting anything heavier than 5 lbs.   Take frequent rest periods. You may tire more easily than usual.   Always rest and elevate the arm affected by your surgery for a period of time equal to your activity time.   Continue doing the exercises given to you by the physical therapist/occupational therapist even after full range of motion has returned. The amount of time this takes will vary from person to person.   After normal range of motion has returned, some stiffness and soreness may persist for 2-3  months. This is normal and will subside.   Begin sports or strenuous activities in moderation. This will give you a chance to rebuild your endurance. Continue to be cautious of heavy lifting or carrying (no more than 10 lbs.) with your affected arm.   You may return to work as recommended by your caregiver.  NUTRITION  You may resume your normal diet.   Make sure you drink plenty of fluids (6-8 glasses a day).   Eat a well-balanced diet. Including daily portions of food from government recommended food groups:   Grains.   Vegetables.   Fruits.   Milk.   Meat & beans.   Oils.  Visit [employer].nl for more information HYGIENE  You may wash your hair.   If your incision (cut from surgery) is closed, you may shower or tub bathe, unless instructed otherwise by your doctor.  FEVER  If you feel feverish or have shaking chills, take your temperature. If your temperature is 102 F (38.9 C) or above, call your caregiver. The fever may mean there is an infection.   If you call early, infection can be treated with antibiotics and hospitalization may be avoided.  PAIN CONTROL  Mild discomfort may occur.   You may need to take an over-the-counter pain medication or a medication prescribed by your caregiver.   Call your caregiver if you experience increased pain.  INCISION CARE  Check your incision daily for increased redness, drainage, swelling, or separation of skin.   Call your caregiver if any  of the above are noted.  ARM AND HAND CARE  If the lymph nodes under your arm were removed with a modified radical mastectomy, there may be a greater tendency for the arm to swell.   Try to avoid having blood pressures taken, blood drawn, or injections given in the affected arm. This is the arm on the same side as the surgery.   Use hand lotion to soften cuticles instead of cutting them to avoid cutting yourself.   Be careful when shaving your under arms. Use an electric  shaver if possible. You may use a deodorant after the incision has completely healed. Until then, clean under your arms with hydrogen peroxide.   Use reasonable precaution when cooking, sewing, and gardening to avoid burning or needle or thorn pricks.   Do not weigh your arm straight down with a package or your purse.   Follow the exercises and instructions given to you by the physical therapist/occupational therapist and your caregiver.  FOLLOW-UP APPOINTMENT Call your caregiver for a follow-up appointment as directed. PROSTHESIS INFORMATION Wear your temporary prosthesis (artificial breast) until your caregiver gives you permission to purchase a permanent one. This will depend upon your rate of healing. We suggest you also wait until you are physically and emotionally ready to shop for one. The suitability depends on several individual factors. We do not endorse any particular prosthesis, but suggest you try several until you are satisfied with appearance and fit. A list of stores may be obtained from your local [employer] at www.cancer.org or 1-800-ACS-2345 (1-862-072-5229). A permanent prosthesis is medically necessary to restore balance. It is also income tax deductible. Be sure all receipts are marked "surgical". It is not essential to purchase a bra. You may sew a pocket into your regular bra. Note: Remember to take all of your medical insurance information with you when shopping for your prosthesis. SELECTING A PROSTHESIS FITTER You may want to ask the following questions when selecting a fitter:  What styles and brands of forms are carried in stock?   How long have the forms been on the market and have there been any problems with them?   Why would one form be better than another?   How long should a particular form last?   May I wear the form for a trial period without obligation?   Do the forms require a prosthetic bra? If so, what is the price range? Must I always  wear that style?   If alterations to the bra are necessary, can they be done at this location or be sent out?   Will I be charged for alterations?   Will I receive suggestions on how to alter my own wardrobe, if necessary?   Will you special order forms or bras if necessary?   Are fitters always available to meet my needs?   What kinds of garments should be worn for the fitting?   Are lounge wear, swim wear, and accessories available?   If I have insurance coverage or Medicare, will you suggest ways for processing the paper work?   Do you keep complete records so that mail reordering is possible?   How are warranty claims handled if I have a problem with the form?  Document Released: 11/08/2003 Document Revised: 03/06/2011 Document Reviewed: 07/13/2007 [facility] Patient Information 2012 [place], [place].

## 2011-09-22 NOTE — Telephone Encounter (Signed)
Will forward to  Dr. Brackbill for review 

## 2011-09-22 NOTE — Telephone Encounter (Signed)
Pt having breast cancer surgery with dr Mikey Bussing [place], can get surgical clearance, pt was just seen 09-01-11, pls fax ok to att dr Derrell Lolling

## 2011-09-29 ENCOUNTER — Encounter (INDEPENDENT_AMBULATORY_CARE_PROVIDER_SITE_OTHER): Payer: Self-pay | Admitting: General Surgery

## 2011-09-29 NOTE — Progress Notes (Unsigned)
Received copy of cardiac clearance from [facility], Dr. Patty Sermons. It stated "Patient ok for surgery from cardiac standpoint. Patient does have IHSS and it will be important to maintain careful fluid balance intraoperatively and avoid hypovalemia".  Information sent to medical records to be scanned into emr.

## 2011-10-06 ENCOUNTER — Encounter (HOSPITAL_COMMUNITY): Payer: Self-pay | Admitting: Pharmacy Technician

## 2011-10-13 NOTE — Pre-Procedure Instructions (Addendum)
ekg 09-01-2011 in epic and on chart lov note 09-01-2011 dr Patty Sermons cardiology in epic and on chart 09-22-2011 cardiac clearance note dr Patty Sermons on chart Chest ct 11-22-2010 on chart and in epic Micro, ua results routed to dr Derrell Lolling in [employer] [facility]

## 2011-10-13 NOTE — Patient Instructions (Addendum)
20 Tricia [name]  10/13/2011   Your procedure is scheduled on:  10-21-2011  Report to [facility] at 815  AM.  Call this number if you have problems the morning of surgery: [phone]   Remember:bring driver for day of surgery   Do not eat food or drink liquids:After Midnight.  .  Take these medicines the morning of surgery with A SIP OF WATER: metoprolol succinate   Do not wear jewelry or make up.  Do not wear lotions, powders, or perfumes.Do not wear deodorant.    Do not bring valuables to the hospital.  Contacts, dentures or bridgework may not be worn into surgery.  Leave suitcase in the car. After surgery it may be brought to your room.  For patients admitted to the hospital, checkout time is 11:00 AM the day of    discharge                             Special Instructions: CHG Shower Use Special Wash: 1/2 bottle night before surgery and 1/2 bottle morning of surgery, use regular soap on face and front and back private area. No shaving for 2 days before showers.   Please read over the following fact sheets that you were given: MRSA Information  [facility] pre op nurse phone number [phone], call if needed

## 2011-10-14 ENCOUNTER — Encounter (HOSPITAL_COMMUNITY): Payer: Self-pay

## 2011-10-14 ENCOUNTER — Encounter (HOSPITAL_COMMUNITY)
Admission: RE | Admit: 2011-10-14 | Discharge: 2011-10-14 | Disposition: A | Discharge status: Home / Self Care | Payer: Medicare Other | Source: Ambulatory Visit | Attending: General Surgery | Admitting: General Surgery

## 2011-10-14 LAB — URINALYSIS, ROUTINE W REFLEX MICROSCOPIC
Glucose, UA: NEGATIVE mg/dL
Protein, ur: NEGATIVE mg/dL
Specific Gravity, Urine: 1.021 (ref 1.005–1.030)
Urobilinogen, UA: 1 mg/dL (ref 0.0–1.0)

## 2011-10-14 LAB — COMPREHENSIVE METABOLIC PANEL
ALT: 14 U/L (ref 0–35)
AST: 18 U/L (ref 0–37)
CO2: 32 mEq/L (ref 19–32)
Chloride: 97 mEq/L (ref 96–112)
GFR calc non Af Amer: 77 mL/min — ABNORMAL LOW (ref >90)
Potassium: 4.1 mEq/L (ref 3.5–5.1)
Sodium: 136 mEq/L (ref 135–145)
Total Bilirubin: 0.7 mg/dL (ref 0.3–1.2)

## 2011-10-14 LAB — URINE MICROSCOPIC-ADD ON

## 2011-10-14 LAB — DIFFERENTIAL
Eosinophils Absolute: 0.1 10*3/uL (ref 0.0–0.7)
Lymphocytes Relative: 20 % (ref 12–46)
Monocytes Relative: 8 % (ref 3–12)

## 2011-10-14 LAB — CBC
Platelets: 174 10*3/uL (ref 150–400)
RBC: 4.79 MIL/uL (ref 3.87–5.11)
WBC: 6.7 10*3/uL (ref 4.0–10.5)

## 2011-10-14 LAB — SURGICAL PCR SCREEN
MRSA, PCR: NEGATIVE
Staphylococcus aureus: NEGATIVE

## 2011-10-17 ENCOUNTER — Telehealth (INDEPENDENT_AMBULATORY_CARE_PROVIDER_SITE_OTHER): Payer: Self-pay | Admitting: General Surgery

## 2011-10-17 NOTE — Telephone Encounter (Signed)
Pt had questions about her routine meds on DOS.   Explained she as not to take any of her routine medications until after her surgery.  She understands.

## 2011-10-20 ENCOUNTER — Telehealth (INDEPENDENT_AMBULATORY_CARE_PROVIDER_SITE_OTHER): Payer: Self-pay | Admitting: General Surgery

## 2011-10-20 NOTE — Telephone Encounter (Signed)
Called patient to advise of post op appointment for 10/29/11 at 4:45. Patient agreed.

## 2011-10-20 NOTE — H&P (Signed)
Tricia [name]    MRN: [id number]   Description: 78 year old female  Provider: Ernestene Mention, MD  Department: [facility]       Diagnoses     Cancer of upper-outer quadrant of female breast   - Primary    174.4         Vitals - Last Recorded       BP Pulse Temp Resp Ht Wt    112/74 60 97.8 F (36.6 C) (Temporal) 16 5\' 5"  (1.651 m) 118 lb 12.8 oz (53.887 kg)    BMI -19.77 kg/m2                 History and Physical    Ernestene Mention, MD   Patient ID: Tricia [name], female   DOB: [birth year], 78 y.o.   MRN: [id number]             HPI Tricia [name] is a 77 y.o. female.  She returns with her husband and son to discuss management  of her multifocal left breast cancer.   This patient was initially seen in the breast clinic on 11/13/18 Dr. Donnie Coffin, Dr. Basilio Cairo, and me. She has some  form of undefined dementia and has poor memory. We had difficulty explaining her situation to her.   She has a palpable biopsy-proven invasive carcinoma in the 2:30 position the left breast and a biopsy proven invasive carcinoma at the 9 oclock position of the left breast. Both of these are receptor positive, HER-2-negative.The initial treatment plan decided was treated this with Femara and then to down stage the tumor. Initially she was given the option of mastectomy and did not like that idea. She was considering to separate lumpectomies.   She has recently seen Dr. Donnie Coffin. He thinks that it is reasonable to proceed with surgical intervention.   MRI performed on 05/27/11 shows that the cancer at the 2:30  position has gone from 13 m to 5 mm and is not enhancing as much. The second cancer in the left breast medially at 9:00 is also smaller measuring 6 mm. There is no adenopathy. There is no contralateral disease.   She is actively followed by Dr. Patty Sermons and by Dr. Timothy Lasso.     Past Medical History   Diagnosis  Date   .  Idiopathic hypertrophic subaortic stenosis     .   Irregular heart beat     .  Fibrocystic breast disease     .  Tobacco abuse     .  Hypertension     .  History of colonoscopy     .  Heart murmur     .  Blood transfusion     .  Cancer         left breast       Past Surgical History   Procedure  Date   .  Cardiac catheterization  01/30/2002       EF 75-80%   .  US echocardiography  12/05/2008       EF 60-65%   .  US echocardiography  12/01/2007       EF 60-65%   .  US echocardiography  07/01/2006       EF 55-60%   .  US echocardiography  06/26/2005       EF 55-60%   .  Appendectomy  1946       Family History   Problem  Relation  Age of Onset   .  Heart attack  Mother     .  Breast cancer  Mother     .  Cancer  Mother         breast   .  Diabetes  Father     .  Stomach cancer  Brother     .  Cancer  Brother         colon      Social History History   Substance Use Topics   .  Smoking status:  Current Everyday Smoker -- 1.0 packs/day   .  Smokeless tobacco:  Never Used   .  Alcohol Use:  Yes         1.5 oz of rum in afternoon      No Known Allergies    Current Outpatient Prescriptions   Medication  Sig  Dispense  Refill   .  alendronate (FOSAMAX) 70 MG tablet  Take 1 tablet (70 mg total) by mouth every 7 (seven) days. Take with a full glass of water on an empty stomach.   12 tablet   4   .  aspirin 81 MG tablet  Take 81 mg by mouth every other day.          .  calcium-vitamin D (OSCAL WITH D) 500-200 MG-UNIT per tablet  Take 1 tablet by mouth every evening.         .  donepezil (ARICEPT) 5 MG tablet           .  glucosamine-chondroitin 500-400 MG tablet  Take 1 tablet by mouth daily.          Marland Kitchen  letrozole (FEMARA) 2.5 MG tablet  Take 1 tablet by mouth Daily.         .  metoprolol succinate (TOPROL XL) 100 MG 24 hr tablet  Take 100 mg by mouth daily.          .  Multiple Vitamin (MULTIVITAMIN) tablet  Take 1 tablet by mouth daily.              Review of Systems  Constitutional: Negative for fever, chills and  unexpected weight change.  HENT: Negative for hearing loss, congestion, sore throat, trouble swallowing and voice change.   Eyes: Negative for visual disturbance.  Respiratory: Positive for apnea. Negative for cough and wheezing.   Cardiovascular: Negative for chest pain, palpitations and leg swelling.  Gastrointestinal: Negative for nausea, vomiting, abdominal pain, diarrhea, constipation, blood in stool, abdominal distention and anal bleeding.  Genitourinary: Negative for hematuria, vaginal bleeding and difficulty urinating.  Musculoskeletal: Negative for arthralgias.  Skin: Negative for rash and wound.  Neurological: Negative for seizures, syncope and headaches.  Hematological: Negative for adenopathy. Does not bruise/bleed easily.  Psychiatric/Behavioral: Positive for confusion. The patient is nervous/anxious.     Blood pressure 112/74, pulse 60, temperature 97.8 F (36.6 C), temperature source Temporal, resp. rate 16, height 5\' 5"  (1.651 m), weight 118 lb 12.8 oz (53.887 kg).   Physical Exam   Constitutional: She is oriented to person, place, and time. She appears well-developed and well-nourished. No distress.  HENT:   Head: Normocephalic and atraumatic.   Nose: Nose normal.   Mouth/Throat: No oropharyngeal exudate.  Eyes: Conjunctivae and EOM are normal. Pupils are equal, round, and reactive to light. Left eye exhibits no discharge. No scleral icterus.  Neck: Neck supple. No JVD present. No tracheal deviation present. No thyromegaly present.  Cardiovascular: Normal rate, regular rhythm and intact distal pulses.    Murmur  heard.      Harsh systolic murmur. Has IHSS.  Pulmonary/Chest: Effort normal and breath sounds normal. No respiratory distress. She has no wheezes. She has no rales. She exhibits no tenderness.       2.5 cm palpable mass left breast at 2:30 position. No other masses or skin changes. Breasts atrophic.No axillary nodes.  Abdominal: Soft. Bowel sounds are normal.  She exhibits no distension and no mass. There is no tenderness. There is no rebound and no guarding.       Small, nontender umbilical hernia  Musculoskeletal: She exhibits no edema and no tenderness.  Lymphadenopathy:    She has no cervical adenopathy.  Neurological: She is alert and oriented to person, place, and time. She exhibits normal muscle tone. Coordination normal.  Skin: Skin is warm. No rash noted. She is not diaphoretic. No erythema. No pallor.  Psychiatric: She has a normal mood and affect. Her behavior is normal. Judgment and thought content normal.    Data Reviewed Dr. Yevonne Pax notes and Dr. Renelda Loma notes. MRI.   Assessment Multifocal invasive cancer left breast, 2:30 position and 9:00 position. Responding to Femara by MRI, but still palpable laterally.   I agree that it is time to offer definitive surgical care options to the patient, her husband and her son, and we did this today.   IHSS.   Dementia   Hypertension   Ongoing tobacco abuse.   Plan We had a very, very long discussion. The son asked to discuss this with me in private, and then we had discussed things together. We talked about left mastectomy versus double needle locallized left lumpectomies. I told them that I thought that  mastectomy was essentially the same in terms of its invasiveness and disability with mastectomy, and that mastectomy would avoid the need for radiation therapy, theoretically.  We talked about doing nothing other than taking the Femara. We talked about delaying a decision. . At the end of the discussion the patient seems to want to go ahead with a left simple mastectomy. I do not think there is any value to lymph node dissection in this case. She is not a candidate for cytotoxic chemotherapy.   We will ask cardiac clearance with Dr. [name]. She just saw him recently.   She was strongly urged to stop smoking   I discussed the indications, details, techniques, and  numerous risks of mastectomy, and the recovery postop with temporary disability and in- home care and issues such as that. They understand these issues. There questions were answered. They agree with this plan.       Angelia Mould. Derrell Lolling, M.D., [facility] General and Minimally invasive Surgery Breast and Colorectal Surgery Office:   [phone] Pager:   [phone]

## 2011-10-21 ENCOUNTER — Ambulatory Visit (HOSPITAL_COMMUNITY): Payer: Medicare Other | Admitting: Anesthesiology

## 2011-10-21 ENCOUNTER — Observation Stay (HOSPITAL_COMMUNITY)
Admission: RE | Admit: 2011-10-21 | Discharge: 2011-10-23 | Disposition: A | Discharge status: Home / Self Care | Payer: Medicare Other | Source: Ambulatory Visit | Attending: General Surgery | Admitting: General Surgery

## 2011-10-21 ENCOUNTER — Encounter (HOSPITAL_COMMUNITY): Payer: Self-pay | Admitting: Anesthesiology

## 2011-10-21 ENCOUNTER — Encounter (HOSPITAL_COMMUNITY)
Admission: RE | Disposition: A | Discharge status: Home / Self Care | Payer: Self-pay | Source: Ambulatory Visit | Attending: General Surgery

## 2011-10-21 ENCOUNTER — Encounter (HOSPITAL_COMMUNITY): Payer: Self-pay | Admitting: *Deleted

## 2011-10-21 DIAGNOSIS — Z79899 Other long term (current) drug therapy: Secondary | ICD-10-CM | POA: Insufficient documentation

## 2011-10-21 DIAGNOSIS — I421 Obstructive hypertrophic cardiomyopathy: Secondary | ICD-10-CM | POA: Insufficient documentation

## 2011-10-21 DIAGNOSIS — Z170 Estrogen receptor positive status [ER+]: Secondary | ICD-10-CM | POA: Insufficient documentation

## 2011-10-21 DIAGNOSIS — Z01812 Encounter for preprocedural laboratory examination: Secondary | ICD-10-CM | POA: Insufficient documentation

## 2011-10-21 DIAGNOSIS — C50419 Malignant neoplasm of upper-outer quadrant of unspecified female breast: Principal | ICD-10-CM | POA: Diagnosis present

## 2011-10-21 DIAGNOSIS — F0390 Unspecified dementia without behavioral disturbance: Secondary | ICD-10-CM | POA: Insufficient documentation

## 2011-10-21 DIAGNOSIS — F17200 Nicotine dependence, unspecified, uncomplicated: Secondary | ICD-10-CM | POA: Insufficient documentation

## 2011-10-21 DIAGNOSIS — I10 Essential (primary) hypertension: Secondary | ICD-10-CM | POA: Insufficient documentation

## 2011-10-21 DIAGNOSIS — C50919 Malignant neoplasm of unspecified site of unspecified female breast: Secondary | ICD-10-CM

## 2011-10-21 DIAGNOSIS — Z7982 Long term (current) use of aspirin: Secondary | ICD-10-CM | POA: Insufficient documentation

## 2011-10-21 HISTORY — PX: BREAST SURGERY: SHX581

## 2011-10-21 LAB — CBC
MCH: 32 pg (ref 26.0–34.0)
MCHC: 34.6 g/dL (ref 30.0–36.0)
MCV: 92.6 fL (ref 78.0–100.0)
Platelets: 123 10*3/uL — ABNORMAL LOW (ref 150–400)
RDW: 13.5 % (ref 11.5–15.5)

## 2011-10-21 LAB — CREATININE, SERUM: Creatinine, Ser: 0.65 mg/dL (ref 0.50–1.10)

## 2011-10-21 SURGERY — MASTECTOMY, SIMPLE
Anesthesia: General | Site: Breast | Laterality: Left | Wound class: Clean

## 2011-10-21 MED ORDER — NEOSTIGMINE METHYLSULFATE 1 MG/ML IJ SOLN
INTRAMUSCULAR | Status: DC | PRN
  Administered 2011-10-21: 4 mg via INTRAVENOUS

## 2011-10-21 MED ORDER — CEFAZOLIN SODIUM-DEXTROSE 2-3 GM-% IV SOLR
INTRAVENOUS | Status: AC
  Filled 2011-10-21: qty 50

## 2011-10-21 MED ORDER — METOPROLOL SUCCINATE ER 100 MG PO TB24
100.0000 mg | ORAL_TABLET | Freq: Every day | ORAL | Status: DC
  Administered 2011-10-22 – 2011-10-23 (×2): 100 mg via ORAL
  Filled 2011-10-21 (×4): qty 1

## 2011-10-21 MED ORDER — SUCCINYLCHOLINE CHLORIDE 20 MG/ML IJ SOLN
INTRAMUSCULAR | Status: DC | PRN
  Administered 2011-10-21: 100 mg via INTRAVENOUS

## 2011-10-21 MED ORDER — CEFAZOLIN SODIUM-DEXTROSE 2-3 GM-% IV SOLR
2.0000 g | INTRAVENOUS | Status: AC
  Administered 2011-10-21: 2 g via INTRAVENOUS

## 2011-10-21 MED ORDER — DONEPEZIL HCL 5 MG PO TABS
5.0000 mg | ORAL_TABLET | Freq: Every day | ORAL | Status: DC
Start: 2011-10-21 — End: 2011-10-23
  Administered 2011-10-21 – 2011-10-22 (×2): 5 mg via ORAL
  Filled 2011-10-21 (×3): qty 1

## 2011-10-21 MED ORDER — ONDANSETRON HCL 4 MG/2ML IJ SOLN
4.0000 mg | Freq: Four times a day (QID) | INTRAMUSCULAR | Status: DC | PRN
  Administered 2011-10-22: 4 mg via INTRAVENOUS
  Filled 2011-10-21: qty 2

## 2011-10-21 MED ORDER — ACETAMINOPHEN 10 MG/ML IV SOLN
INTRAVENOUS | Status: DC | PRN
  Administered 2011-10-21: 1000 mg via INTRAVENOUS

## 2011-10-21 MED ORDER — CISATRACURIUM BESYLATE (PF) 10 MG/5ML IV SOLN
INTRAVENOUS | Status: DC | PRN
  Administered 2011-10-21: 4 mg via INTRAVENOUS

## 2011-10-21 MED ORDER — ACETAMINOPHEN 10 MG/ML IV SOLN
INTRAVENOUS | Status: AC
  Filled 2011-10-21: qty 100

## 2011-10-21 MED ORDER — LACTATED RINGERS IV SOLN
INTRAVENOUS | Status: DC
  Administered 2011-10-21: 11:00:00 via INTRAVENOUS
  Administered 2011-10-21: 1000 mL via INTRAVENOUS

## 2011-10-21 MED ORDER — LIDOCAINE HCL (CARDIAC) 20 MG/ML IV SOLN
INTRAVENOUS | Status: DC | PRN
  Administered 2011-10-21: 180 mg via INTRAVENOUS

## 2011-10-21 MED ORDER — EPHEDRINE SULFATE 50 MG/ML IJ SOLN
INTRAMUSCULAR | Status: DC | PRN
  Administered 2011-10-21: 5 mg via INTRAVENOUS

## 2011-10-21 MED ORDER — ONDANSETRON HCL 4 MG PO TABS: 4.0000 mg | ORAL_TABLET | Freq: Four times a day (QID) | ORAL | Status: DC | PRN

## 2011-10-21 MED ORDER — POTASSIUM CHLORIDE IN NACL 20-0.9 MEQ/L-% IV SOLN
INTRAVENOUS | Status: AC
  Filled 2011-10-21: qty 1000

## 2011-10-21 MED ORDER — PROPOFOL 10 MG/ML IV BOLUS
INTRAVENOUS | Status: DC | PRN
  Administered 2011-10-21: 150 mg via INTRAVENOUS

## 2011-10-21 MED ORDER — MORPHINE SULFATE 2 MG/ML IJ SOLN
1.0000 mg | INTRAMUSCULAR | Status: DC | PRN
  Administered 2011-10-21 – 2011-10-22 (×2): 1 mg via INTRAVENOUS
  Filled 2011-10-21 (×2): qty 1

## 2011-10-21 MED ORDER — HYDROCODONE-ACETAMINOPHEN 5-325 MG PO TABS
1.0000 | ORAL_TABLET | ORAL | Status: DC | PRN
  Administered 2011-10-23: 1 via ORAL
  Filled 2011-10-21: qty 1

## 2011-10-21 MED ORDER — CALCIUM CARBONATE-VITAMIN D 500-200 MG-UNIT PO TABS
1.0000 | ORAL_TABLET | Freq: Every evening | ORAL | Status: DC
  Administered 2011-10-21 – 2011-10-22 (×2): 1 via ORAL
  Filled 2011-10-21 (×3): qty 1

## 2011-10-21 MED ORDER — POTASSIUM CHLORIDE IN NACL 20-0.9 MEQ/L-% IV SOLN
INTRAVENOUS | Status: DC
  Administered 2011-10-22 (×2): via INTRAVENOUS
  Filled 2011-10-21 (×4): qty 1000

## 2011-10-21 MED ORDER — HYDROMORPHONE HCL PF 1 MG/ML IJ SOLN
0.2500 mg | INTRAMUSCULAR | Status: DC | PRN
Start: 2011-10-21 — End: 2011-10-21

## 2011-10-21 MED ORDER — FENTANYL CITRATE 0.05 MG/ML IJ SOLN
INTRAMUSCULAR | Status: DC | PRN
  Administered 2011-10-21: 100 ug via INTRAVENOUS
  Administered 2011-10-21: 50 ug via INTRAVENOUS
  Administered 2011-10-21: 100 ug via INTRAVENOUS

## 2011-10-21 MED ORDER — ALENDRONATE SODIUM 70 MG PO TABS: 70.0000 mg | ORAL_TABLET | ORAL | Status: DC

## 2011-10-21 MED ORDER — GLYCOPYRROLATE 0.2 MG/ML IJ SOLN
INTRAMUSCULAR | Status: DC | PRN
  Administered 2011-10-21: 0.6 mg via INTRAVENOUS

## 2011-10-21 MED ORDER — HEPARIN SODIUM (PORCINE) 5000 UNIT/ML IJ SOLN
5000.0000 [IU] | Freq: Once | INTRAMUSCULAR | Status: AC
  Administered 2011-10-21: 5000 [IU] via SUBCUTANEOUS
  Filled 2011-10-21: qty 1

## 2011-10-21 MED ORDER — ONDANSETRON HCL 4 MG/2ML IJ SOLN
INTRAMUSCULAR | Status: DC | PRN
  Administered 2011-10-21: 4 mg via INTRAVENOUS

## 2011-10-21 MED ORDER — HEPARIN SODIUM (PORCINE) 5000 UNIT/ML IJ SOLN
5000.0000 [IU] | Freq: Three times a day (TID) | INTRAMUSCULAR | Status: DC
  Administered 2011-10-22 (×3): 5000 [IU] via SUBCUTANEOUS
  Filled 2011-10-21 (×7): qty 1

## 2011-10-21 SURGICAL SUPPLY — 29 items
APPLIER CLIP 11 MED OPEN (CLIP) ×2
BANDAGE ELASTIC 6 VELCRO ST LF (GAUZE/BANDAGES/DRESSINGS) IMPLANT
BENZOIN TINCTURE PRP APPL 2/3 (GAUZE/BANDAGES/DRESSINGS) ×2 IMPLANT
BINDER BREAST LRG (GAUZE/BANDAGES/DRESSINGS) ×2 IMPLANT
CLIP APPLIE 11 MED OPEN (CLIP) ×1 IMPLANT
CLOTH BEACON ORANGE TIMEOUT ST (SAFETY) ×2 IMPLANT
CLSR STERI-STRIP ANTIMIC 1/2X4 (GAUZE/BANDAGES/DRESSINGS) ×4 IMPLANT
DRAIN CHANNEL RND F F (WOUND CARE) ×4 IMPLANT
DRAPE LAPAROSCOPIC ABDOMINAL (DRAPES) ×2 IMPLANT
DRAPE UTILITY XL STRL (DRAPES) ×2 IMPLANT
DRSG PAD ABDOMINAL 8X10 ST (GAUZE/BANDAGES/DRESSINGS) ×2 IMPLANT
EVACUATOR SILICONE 100CC (DRAIN) ×4 IMPLANT
GLOVE BIOGEL PI IND STRL 7.0 (GLOVE) ×1 IMPLANT
GLOVE BIOGEL PI INDICATOR 7.0 (GLOVE) ×1
GLOVE EUDERMIC 7 POWDERFREE (GLOVE) ×2 IMPLANT
GOWN STRL NON-REIN LRG LVL3 (GOWN DISPOSABLE) ×2 IMPLANT
GOWN STRL REIN XL XLG (GOWN DISPOSABLE) ×4 IMPLANT
KIT BASIN OR (CUSTOM PROCEDURE TRAY) ×2 IMPLANT
PACK GENERAL/GYN (CUSTOM PROCEDURE TRAY) ×2 IMPLANT
PAD ABD 7.5X8 STRL (GAUZE/BANDAGES/DRESSINGS) ×4 IMPLANT
SPONGE DRAIN TRACH 4X4 STRL 2S (GAUZE/BANDAGES/DRESSINGS) ×2 IMPLANT
SPONGE GAUZE 4X4 12PLY (GAUZE/BANDAGES/DRESSINGS) ×2 IMPLANT
STAPLER VISISTAT 35W (STAPLE) ×2 IMPLANT
SUT ETHILON 3 0 PS 1 (SUTURE) ×8 IMPLANT
SUT MNCRL AB 4-0 PS2 18 (SUTURE) ×4 IMPLANT
SUT SILK 2 0 SH (SUTURE) ×2 IMPLANT
SUT VIC AB 3-0 SH 18 (SUTURE) ×2 IMPLANT
SUT VIC AB 3-0 SH 8-18 (SUTURE) ×2 IMPLANT
TOWEL OR 17X26 10 PK STRL BLUE (TOWEL DISPOSABLE) ×4 IMPLANT

## 2011-10-21 NOTE — Transfer of Care (Signed)
Immediate Anesthesia Transfer of Care Note  Patient: Tricia [name]  Procedure(s) Performed: Procedure(s) (LRB): TOTAL MASTECTOMY (Left)  Patient Location: PACU  Anesthesia Type: General  Level of Consciousness: awake and sedated  Airway & Oxygen Therapy: Patient Spontanous Breathing and Patient connected to face mask oxygen  Post-op Assessment: Report given to PACU RN  Post vital signs: Reviewed and stable  Complications: No apparent anesthesia complications

## 2011-10-21 NOTE — Anesthesia Postprocedure Evaluation (Signed)
  Anesthesia Post-op Note  Patient: Tricia [name]  Procedure(s) Performed: Procedure(s) (LRB): TOTAL MASTECTOMY (Left)  Patient Location: PACU  Anesthesia Type: General  Level of Consciousness: oriented and sedated  Airway and Oxygen Therapy: Patient Spontanous Breathing and Patient connected to nasal cannula oxygen  Post-op Pain: mild  Post-op Assessment: Post-op Vital signs reviewed, Patient's Cardiovascular Status Stable, Respiratory Function Stable and Patent Airway  Post-op Vital Signs: stable  Complications: No apparent anesthesia complications

## 2011-10-21 NOTE — Plan of Care (Signed)
Problem: Diagnosis - Type of Surgery Goal: General Surgical Patient Education (See Patient Education module for education specifics) Outcome: Not Met (add Reason) Post surgery

## 2011-10-21 NOTE — Anesthesia Procedure Notes (Signed)
Procedure Name: Intubation Date/Time: 10/21/2011 10:06 AM Performed by: Hulan Fess Pre-anesthesia Checklist: Patient identified, Emergency Drugs available, Suction available, Patient being monitored and Timeout performed Patient Re-evaluated:Patient Re-evaluated prior to inductionOxygen Delivery Method: Circle system utilized Preoxygenation: Pre-oxygenation with 100% oxygen Intubation Type: IV induction Ventilation: Mask ventilation without difficulty Grade View: Grade I Tube type: Oral Tube size: 8.0 mm Number of attempts: 1 Placement Confirmation: ETT inserted through vocal cords under direct vision and breath sounds checked- equal and bilateral Secured at: 21.5 cm Tube secured with: Tape Dental Injury: Teeth and Oropharynx as per pre-operative assessment

## 2011-10-21 NOTE — Anesthesia Preprocedure Evaluation (Signed)
Anesthesia Evaluation  Patient identified by MRN, date of birth, ID band Patient awake  General Assessment Comment:Advanced yrs   Reviewed: Allergy & Precautions, H&P , NPO status , Patient's Chart, lab work & pertinent test results, reviewed documented beta blocker date and time   Airway Mallampati: II TM Distance: >3 FB     Dental  (+) Teeth Intact and Dental Advisory Given   Pulmonary Current Smoker,  breath sounds clear to auscultation        Cardiovascular hypertension, Pt. on medications Rhythm:Regular Rate:Normal + Systolic murmurs IHSS Given CV clearance   Neuro/Psych negative neurological ROS  negative psych ROS   GI/Hepatic negative GI ROS, Neg liver ROS,   Endo/Other  negative endocrine ROS  Renal/GU negative Renal ROS  negative genitourinary   Musculoskeletal negative musculoskeletal ROS (+)   Abdominal   Peds negative pediatric ROS (+)  Hematology negative hematology ROS (+)   Anesthesia Other Findings   Reproductive/Obstetrics Breast Cancer                           Anesthesia Physical Anesthesia Plan  ASA: III  Anesthesia Plan: General   Post-op Pain Management:    Induction: Intravenous  Airway Management Planned: Oral ETT  Additional Equipment:   Intra-op Plan:   Post-operative Plan: Extubation in OR  Informed Consent: I have reviewed the patients History and Physical, chart, labs and discussed the procedure including the risks, benefits and alternatives for the proposed anesthesia with the patient or authorized representative who has indicated his/her understanding and acceptance.   Dental advisory given  Plan Discussed with: CRNA and Surgeon  Anesthesia Plan Comments:         Anesthesia Quick Evaluation

## 2011-10-21 NOTE — Op Note (Signed)
Patient Name:           Tricia [name]   Date of Surgery:        10/21/2011  Pre op Diagnosis:      Multifocal invasive carcinoma left breast  Post op Diagnosis:    same  Procedure:                 Left total mastectomy  Surgeon:                     Angelia Mould. Derrell Lolling, M.D., FACS  Assistant:                      none  Operative Indications:   Tricia [name] is a 77 y.o. female.   This patient was initially seen in the breast clinic on 11/13/18 Dr. Donnie Coffin, Dr. Basilio Cairo, and me. She has some form of undefined dementia and has poor memory. We had difficulty explaining her situation to her. Her husband and 2 sons have been very involved in her care and decision making. She has a palpable biopsy-proven invasive carcinoma in the 2:30 position the left breast and a biopsy proven invasive carcinoma at the 9 oclock position of the left breast, receptor positive. Both of these are receptor positive, HER-2-negative.The initial treatment plan decided was treated this with Femara and try to down stage the tumor. Initially she was given the option of mastectomy and did not like that idea. She was considering to separate lumpectomies. She refused surgery initially. She has recently seen Dr. Donnie Coffin. He thinks that it is reasonable to proceed with surgical intervention.  MRI performed on 05/27/11 shows that the cancer at the 2:30 position has gone from 13 m to 5 mm and is not enhancing as much. The second cancer in the left breast medially at 9:00 is also smaller measuring 6 mm. There is no adenopathy. There is no contralateral disease. On exam she still has a palpable mass in the upper outer quadrant left breast, possibly 2.5 cm in size. There is no skin involvement. There is no axillary adenopathy. I discussed management plans with the patient, Dr. Donnie Coffin, and the family. We have decided to proceed with a left total mastectomy. We do not feel that lymph node dissection would be of benefit.   Operative Findings:        The 2 cm palpable mass in the upper-outer quadrant left breast was still present. There is no skin change. There is no palpable adenopathy. I could feel no other abnormalities of the breast. The surgery was uneventful.  Procedure in Detail:          Following the induction of general endotracheal anesthesia, the patient's left breast, chest wall axilla and neck were prepped and draped in a sterile fashion. Intravenous antibiotics were given and a surgical time out was performed.  Using a marking pen to mark a generally transverse incision, incorporating the skin over the palpable mass at the 2:00 position. The transverse elliptical incision was made, incorporating the areola. Skin flaps were raised superiorly to the infraclavicular area, laterally to the latissimus dorsi muscle, inferiorly to the anterior rectus sheath and medially to the parasternal area. The breast was then dissected off of the pectoralis major and minor muscles with electrocautery. I marked the lateral skin margin with a silk suture. The specimen was removed and sent fresh to pathology. Hemostasis was excellent and achieved with electrocautery. The wound was irrigated with saline.  A single 19 [place] Blake drain was placed in the wound and brought out through a separate stab incision inferolateral to the incision. This was sutured to the skin with a nylon suture and connected to a suction bulb. It appeared that a single drain would be adequate. The subcutaneous tissue was closed with numerous interrupted sutures of 3-0 Vicryl and the skin closed with a running subcuticular suture of 4-0 Monocryl and Steri-Strips. Clean bandages and a breast binder were placed. The patient tolerated the procedure well. The patient was taken to recovery stable. EBL 50 cc or less. Counts correct. There were no complications.     Angelia Mould. Derrell Lolling, M.D., FACS General and Minimally Invasive Surgery Breast and Colorectal Surgery  10/21/2011 11:33 AM

## 2011-10-21 NOTE — Interval H&P Note (Signed)
History and Physical Interval Note:  10/21/2011 9:37 AM  Tricia [name]  has presented today for surgery, with the diagnosis of left breast cancer  The goals of treatment and the various methods of treatment have been discussed with the patient and family. After consideration of risks, benefits and other options for treatment, the patient and family have consented to  Procedure(s) (LRB): TOTAL MASTECTOMY (Left) as a surgical intervention .  The patient's history has been reviewed and the patient examined today, no change in status, stable for surgery.  I have reviewed the patient's chart and labs.  Questions were answered to the patient's satisfaction.     Ernestene Mention

## 2011-10-22 LAB — CBC
HCT: 37.5 % (ref 36.0–46.0)
MCH: 32.4 pg (ref 26.0–34.0)
MCV: 91.9 fL (ref 78.0–100.0)
Platelets: 135 10*3/uL — ABNORMAL LOW (ref 150–400)
RDW: 13.3 % (ref 11.5–15.5)
WBC: 6.4 10*3/uL (ref 4.0–10.5)

## 2011-10-22 MED ORDER — POLYETHYLENE GLYCOL 3350 17 G PO PACK
17.0000 g | PACK | Freq: Every day | ORAL | Status: DC
  Administered 2011-10-22 – 2011-10-23 (×2): 17 g via ORAL
  Filled 2011-10-22 (×2): qty 1

## 2011-10-22 NOTE — Progress Notes (Signed)
Utilization review completed.  

## 2011-10-22 NOTE — Progress Notes (Addendum)
1 Day Post-Op  Subjective: Alert. Mental status at baseline. Minimal pain. Denies arm pain or numbness. No nausea or vomiting. Vital signs stable.  Moderate drainage.  Objective: Vital signs in last 24 hours: Temp:  [96.7 F (35.9 C)-98.1 F (36.7 C)] 98.1 F (36.7 C) (07/24 0206) Pulse Rate:  [63-90] 68  (07/24 0206) Resp:  [13-18] 16  (07/24 0206) BP: (128-180)/(79-102) 154/79 mmHg (07/24 0206) SpO2:  [94 %-100 %] 96 % (07/24 0206) Weight:  [116 lb (52.617 kg)] 116 lb (52.617 kg) (07/23 1245) Last BM Date: 10/20/11  Intake/Output from previous day: 07/23 0701 - 07/24 0700 In: 2460 [P.O.:460; I.V.:2000] Out: 2675 [Urine:2200; Drains:475] Intake/Output this shift: Total I/O In: 700 [I.V.:700] Out: 2300 [Urine:2000; Drains:300]  General appearance: alert Resp: clear to auscultation bilaterally Breasts: , left mastectomy dressing clean and dry. Some bruising, No significant hematoma. No active bleeding. Drain is functioning, serosanguinous.  Lab Results:  Results for orders placed during the hospital encounter of 10/21/11 (from the past 24 hour(s))  CBC     Status: Abnormal   Collection Time   10/21/11  1:30 PM      Component Value Range   WBC 4.7  4.0 - 10.5 K/uL   RBC 4.03  3.87 - 5.11 MIL/uL   Hemoglobin 12.9  12.0 - 15.0 g/dL   HCT 16.1  09.6 - 04.5 %   MCV 92.6  78.0 - 100.0 fL   MCH 32.0  26.0 - 34.0 pg   MCHC 34.6  30.0 - 36.0 g/dL   RDW 40.9  81.1 - 91.4 %   Platelets 123 (*) 150 - 400 K/uL  CREATININE, SERUM     Status: Abnormal   Collection Time   10/21/11  1:30 PM      Component Value Range   Creatinine, Ser 0.65  0.50 - 1.10 mg/dL   GFR calc non Af Amer 80 (*) >90 mL/min   GFR calc Af Amer >90  >90 mL/min     Studies/Results: @RISRSLT24 @     . 0.9 % NaCl with KCl 20 mEq / L      . calcium-vitamin D  1 tablet Oral QPM  .  ceFAZolin (ANCEF) IV  2 g Intravenous 60 min Pre-Op  . donepezil  5 mg Oral QHS  . heparin  5,000 Units Subcutaneous Once    . heparin  5,000 Units Subcutaneous Q8H  . metoprolol succinate  100 mg Oral Q breakfast  . DISCONTD: alendronate  70 mg Oral Q7 days     Assessment/Plan: s/p Procedure(s): TOTAL MASTECTOMY  POD #1. Stable. Will mobilize out of bed and to see how well she can ambulate. Suspect she will be fairly dependent on her husband and 2 sons when she leaves the hospital. Daily MiraLAX Check CBC. Watch drainage. Anticipate discharge home tomorrow  Dementia. At baseline.  IHSS. Maintain hydration. IV at 50 cc/hour.  Hypertension. Controlled. Followed by Dr. Patty Sermons as an outpatient.  Ongoing tobacco abuse. Observed carefully for wound complications.    LOS: 1 day    [facility] M. Derrell Lolling, M.D., [facility] General and Minimally invasive Surgery Breast and Colorectal Surgery Office:   [phone] Pager:   [phone]  10/22/2011  . .prob

## 2011-10-23 MED ORDER — HYDROCODONE-ACETAMINOPHEN 5-325 MG PO TABS: 1.0000 | ORAL_TABLET | ORAL | Status: AC | PRN

## 2011-10-23 NOTE — Progress Notes (Signed)
2 Days Post-Op  Subjective: Patient stable and lower. States she wants to go home. Doesn't have great in slight. Ambulatory. Tolerating diet. Voiding uneventfully. Appears comfortable.  Pathology report shows multifocal cancer with negative margins. A left pathology report and remove the patient and her family.  Biggest concern is her dementia. Her husband and one son live with her and one son from [place] is in town. With the family and with home health nursing I think we will be okay.  Objective: Vital signs in last 24 hours: Temp:  [97.9 F (36.6 C)-98.6 F (37 C)] 97.9 F (36.6 C) (07/25 0552) Pulse Rate:  [60-75] 68  (07/25 0552) Resp:  [18] 18  (07/25 0552) BP: (158-171)/(79-88) 159/84 mmHg (07/25 0552) SpO2:  [96 %-98 %] 96 % (07/25 0552) Last BM Date: 10/20/11  Intake/Output from previous day: 07/24 0701 - 07/25 0700 In: 1480 [P.O.:1080; I.V.:400] Out: 2561 [Urine:2350; Drains:211] Intake/Output this shift: Total I/O In: 360 [P.O.:360] Out: 1575 [Urine:1450; Drains:125]  General appearance: alert.  No status at baseline. Verbal skills. Insight and memory not too good. In no distress. Breasts:, left mastectomy wound examined. There is some ecchymoses but no necrosis. No hematoma. Nursing willredress the wound. The drain is functioning normally. Outputs are diminished.  Lab Results:  Results for orders placed during the hospital encounter of 10/21/11 (from the past 24 hour(s))  CBC     Status: Abnormal   Collection Time   10/22/11  6:05 AM      Component Value Range   WBC 6.4  4.0 - 10.5 K/uL   RBC 4.08  3.87 - 5.11 MIL/uL   Hemoglobin 13.2  12.0 - 15.0 g/dL   HCT 29.5  62.1 - 30.8 %   MCV 91.9  78.0 - 100.0 fL   MCH 32.4  26.0 - 34.0 pg   MCHC 35.2  30.0 - 36.0 g/dL   RDW 65.7  84.6 - 96.2 %   Platelets 135 (*) 150 - 400 K/uL     Studies/Results: @RISRSLT24 @     . calcium-vitamin D  1 tablet Oral QPM  . donepezil  5 mg Oral QHS  . heparin  5,000 Units  Subcutaneous Q8H  . metoprolol succinate  100 mg Oral Q breakfast  . polyethylene glycol  17 g Oral Daily     Assessment/Plan: s/p Procedure(s): TOTAL MASTECTOMY  POD #2. Stable.Ready for discharge home today. Arrange home health nursing.  Instruct the patient and family regarding JP drain care and record keeping. Return to see me in office in one week. Excellent prescription for Vicodin given. Diet and activities discussed.     LOS: 2 days    Shakthi Scipio M. Derrell Lolling, M.D., [facility] General and Minimally invasive Surgery Breast and Colorectal Surgery Office:   [phone] Pager:   [phone]  10/23/2011  . .prob

## 2011-10-23 NOTE — Progress Notes (Signed)
Utilization review completed.  

## 2011-10-23 NOTE — Care Management Note (Signed)
    Page 1 of 2   10/23/2011     4:51:55 PM   CARE MANAGEMENT NOTE 10/23/2011  Patient:  Tricia [name], Tricia [name]   Account Number:  [id number]  Date Initiated:  10/23/2011  Documentation initiated by:  Colleen Can  Subjective/Objective Assessment:   dx left breast cancer; left total mastectomy     Action/Plan:   CM spoke with patient, spouse and son. Plans are that patient will return to her home in [place], [place] where son and spouse will be caregivers. Orders for HHrn for wound managenment. [facility] will provide hh services   Anticipated DC Date:  10/23/2011   Anticipated DC Plan:  HOME W HOME HEALTH SERVICES  In-house referral  NA      DC Planning Services  CM consult      [facility] Choice  HOME HEALTH   Choice offered to / List presented to:  C-1 Patient   DME arranged  NA      DME agency  NA     HH arranged  HH-1 RN      [facility] [facility] agency  [facility]   Status of service:  Completed, signed off Medicare Important Message given?  NA - LOS <3 / Initial given by admissions (If response is "NO", the following Medicare IM given date fields will be blank) Date Medicare IM given:   Date Additional Medicare IM given:    Discharge Disposition:  HOME W HOME HEALTH SERVICES  Per UR Regulation:  Reviewed for med. necessity/level of care/duration of stay  If discussed at Long Length of Stay Meetings, dates discussed:    Comments:

## 2011-10-23 NOTE — Progress Notes (Signed)
Pt and son verbalized understanding of discharge instructions. Pt assessment has not changed from am. Prescriptions and d/c forms were given.

## 2011-10-23 NOTE — Discharge Summary (Addendum)
Patient ID: Tricia [name] [id number] 78 y.o. 1925-06-21  10/21/2011  Discharge date and time: October 23, 2011  Admitting Physician: Ernestene Mention  Discharge Physician: Ernestene Mention  Admission Diagnoses: left breast cancer  Discharge Diagnoses: multifocal invasive ductal carcinoma left breast, receptor positive, pathologic stage T1c,Nx.  Operations: Procedure(s): TOTAL MASTECTOMY  Admission Condition: good  Discharged Condition: good  Indication for Admission: Tricia [name] is a 78 y.o. female This patient was initially seen in the breast clinic on 11/13/2010.  By  Dr. Donnie Coffin,  and me. She has some form of undefined dementia and has poor memory. We had difficulty explaining her situation to her. She lives with her husband and one of her sons. Another son lives in [place]. They have all been involved in her care and decision making. She has a palpable biopsy-proven invasive carcinoma in the 2:30 position of the beast and a biopsy proven invasive carcinoma at the 9 oclock position of the left breast. Both of these are receptor positive, HER-2-negative.The initial treatment plan decided was treated this with Femara and then to down stage the tumor. Initially she was given the option of mastectomy and did not like that idea. She was considering to separate lumpectomies.  She has recently seen Dr. Donnie Coffin. He thinks that it is reasonable to proceed with surgical intervention.  MRI performed on 05/27/11 shows that the cancer at the 2:30 position has gone from 13 m to 5 mm and is not enhancing as much. The second cancer in the left breast medially at 9:00 is also smaller measuring 6 mm. There is no adenopathy. There is no contralateral disease.   We had a lengthy discussion in the office on numerous occasions. The patient and family seem to understand and she has multifocal cancer in the left breast. She has consented to mastectomy. We do not plan any nodeiopsies as she is not a candidate  for chemotherapy and she is clinically node negative.   Hospital Course: on the day of admission the patient was taken to the operating room and underwent a left total mastectomy. The surgery was uneventful. She tolerated anesthesia well.  Pathology report shows 3 separate foci of invasive ductal carcinoma, receptor positive, largest focus 1.1 cm. Margins were negative. The patient and family were given reports.  On postop day 1 she was having too much pain to go home but by postop day 2 she was feeling much better and wanted to go home. She was ambulatory, tolerating diet, and voiding uneventfully. Examination of her left mastectomy wound reveals some ecchymoses but no skin necrosis.  There was no hematoma. Drains were functioning and serosanguineous.hemoglobin was 13.2 on postop day #1.  The patient was instructed in diet and activities. The wound was redressed prior to discharge. She was given a prescription for Vicodin. Home Health Nursing was requested due to dementia and homebound status.  She'll return to see me in the office in 5-7 days for a wound and drain check. She'll be referred to physical therapy as an outpatient. She has excellent range of motion of her shoulder at the time of discharge.  Followup will be arranged with Dr. Donnie Coffin next month. Consults: None  Significant Diagnostic Studies: pathology Treatments: surgery: left total mastectomy Disposition: Home  Patient Instructions:   Tricia [name], Tricia [name]  Home Medication Instructions WUJ:[id number]   Printed on:10/23/11 0600  Medication Information                    aspirin 81  MG tablet Take 81 mg by mouth every other day.            Multiple Vitamin (MULTIVITAMIN) tablet Take 1 tablet by mouth daily.             glucosamine-chondroitin 500-400 MG tablet Take 1 tablet by mouth daily.            calcium-vitamin D (OSCAL WITH D) 500-200 MG-UNIT per tablet Take 1 tablet by mouth every evening.           metoprolol  succinate (TOPROL XL) 100 MG 24 hr tablet Take 100 mg by mouth daily with breakfast.            donepezil (ARICEPT) 5 MG tablet Take 5 mg by mouth at bedtime.            alendronate (FOSAMAX) 70 MG tablet Take 70 mg by mouth every 7 (seven) days. On Thursdays.  Take with a full glass of water on an empty stomach.             Activity: activity as tolerated Diet: low fat, low cholesterol diet Wound Care: as directed  Follow-up:  With Dr. Derrell Lolling in one week  Signed: Angelia Mould. Derrell Lolling, M.D., FACS General and minimally invasive surgery Breast and Colorectal Surgery  10/23/2011, 6:00 AM

## 2011-10-29 ENCOUNTER — Ambulatory Visit (INDEPENDENT_AMBULATORY_CARE_PROVIDER_SITE_OTHER): Payer: Medicare Other | Admitting: General Surgery

## 2011-10-29 ENCOUNTER — Encounter (INDEPENDENT_AMBULATORY_CARE_PROVIDER_SITE_OTHER): Payer: Self-pay | Admitting: General Surgery

## 2011-10-29 VITALS — BP 118/76 | HR 76 | Temp 98.2°F | Resp 16 | Ht 65.5 in | Wt 118.2 lb

## 2011-10-29 DIAGNOSIS — C50419 Malignant neoplasm of upper-outer quadrant of unspecified female breast: Secondary | ICD-10-CM

## 2011-10-29 NOTE — Progress Notes (Signed)
Subjective:     Patient ID: Tricia [name], female   DOB: 09/08/1925, 78 y.o.   MRN: [id number]  HPI This 78 year old and underwent left total mastectomy on 10/21/2011. Final pathology shows 3 separate foci of invasive ductal carcinoma, largest focus 1.1 cm, margins are clear. ER 99%, PR 8%, HER-2-negative, Ki-67 9%. Final stage by ypT1c, ypNx.Marland Kitchen  She had been on neoadjuvant antiestrogen therapy for several  months. She has had a difficult time making decisions as did her husband due to incomplete insight. She has memory problems as well. Her sons have been helping the process along.  She continues to smoke, which I discouraged.  Review of Systems     Objective:   Physical Exam Patient is alert and in no distress. Her husband and one of her sons are with her today.  Left mastectomy wound shows some ecchymoses but no skin necrosis. No infection. No hematoma. The drain is functioning, but it appears draining more than 30 cc per day. Drainage record is incomplete, however. I left a drain in and redressed the wound.  Range of motion left shoulder is excellent.      Assessment:     Multifocal invasive ductal carcinoma left breast, status post neoadjuvant and adjuvant therapy and subsequent left total mastectomy.  Pathologic stage ypT1c, ypNx., receptor positive, HER-2-negative.    Plan:     Return to see me in one week, hopefully we can remove the drain at that time  Diet, activities, and range of motion left shoulder discussed  Continue antiestrogen therapy and followup Dr. Donnie Coffin  Pathology was discussed.    Angelia Mould. Derrell Lolling, M.D., [facility] General and Minimally invasive Surgery Breast and Colorectal Surgery Office:   [phone] Pager:   [phone]

## 2011-10-29 NOTE — Patient Instructions (Signed)
Your left mastectomy wound is healing without any obvious complication. There is no infection.  We are going to leave the drain in one more week.  You will be given an appointment to see [place] in one week, hopefully we can remove the drain at that time.

## 2011-10-31 ENCOUNTER — Encounter (INDEPENDENT_AMBULATORY_CARE_PROVIDER_SITE_OTHER): Payer: Self-pay | Admitting: General Surgery

## 2011-10-31 ENCOUNTER — Ambulatory Visit (INDEPENDENT_AMBULATORY_CARE_PROVIDER_SITE_OTHER): Payer: Medicare Other | Admitting: General Surgery

## 2011-10-31 NOTE — Progress Notes (Unsigned)
Patient seen today due to call with concern about drainage leaking from site where drain has been placed. The patient was assessed by clinic mgr (Deanna) who was able to clear the drain and restore the bulb's ability to draw fluid from site. The bulb and tube were working properly and the patient's son was shown correct way to drain the bulb and restore suction. Patient confirmed to see Dr. Jamey Ripa on 11/05/11 for drain check. Patient stated that she was feeling fine otherwise and just wanted the bulb checked and not wait over the weekend.

## 2011-11-05 ENCOUNTER — Encounter (INDEPENDENT_AMBULATORY_CARE_PROVIDER_SITE_OTHER): Payer: Self-pay | Admitting: Surgery

## 2011-11-05 ENCOUNTER — Ambulatory Visit (INDEPENDENT_AMBULATORY_CARE_PROVIDER_SITE_OTHER): Payer: Medicare Other | Admitting: Surgery

## 2011-11-05 VITALS — BP 136/80 | HR 64 | Temp 97.2°F | Resp 16 | Ht 65.5 in | Wt 121.0 lb

## 2011-11-05 DIAGNOSIS — Z09 Encounter for follow-up examination after completed treatment for conditions other than malignant neoplasm: Secondary | ICD-10-CM

## 2011-11-05 NOTE — Progress Notes (Signed)
Tricia [name]    [id number] 11/05/2011    1925/06/30   CC: Post op Mastectomy  HPI: The patient returns for post op follow-up. She underwent a Left total mastectomy on 10/21/2011. Over all she feels that she is doing well. She is without concern today but would like to get the drain out  PE: VITAL SIGNS: BP 136/80  Pulse 64  Temp 97.2 F (36.2 C) (Temporal)  Resp 16  Ht 5' 5.5" (1.664 m)  Wt 121 lb (54.885 kg)  BMI 19.83 kg/m2  The incision is healing nicely and there is no evidence of infection or hematoma.  The drain is still about 40cc/day.    IMPRESSION: Patient doing well. Drain not ready to come out  PLAN: Her next visit will be in about five days.

## 2011-11-06 ENCOUNTER — Telehealth (INDEPENDENT_AMBULATORY_CARE_PROVIDER_SITE_OTHER): Payer: Self-pay | Admitting: General Surgery

## 2011-11-06 NOTE — Telephone Encounter (Signed)
KAYE WITH [facility] CALLED TO NOTIFY DR. Derrell Lolling THAT THEY ARE DISCONTINUING HOME CARE VISITS SINCE SON CAN MANAGE DRAIN AND DRESSINGS. PT IS SCHEDULED TO SEE DR. INGRAM ON Monday 11-10-11/GY

## 2011-11-10 ENCOUNTER — Ambulatory Visit (INDEPENDENT_AMBULATORY_CARE_PROVIDER_SITE_OTHER): Payer: Medicare Other | Admitting: General Surgery

## 2011-11-10 ENCOUNTER — Encounter (INDEPENDENT_AMBULATORY_CARE_PROVIDER_SITE_OTHER): Payer: Self-pay | Admitting: General Surgery

## 2011-11-10 VITALS — BP 142/84 | HR 70 | Temp 97.4°F | Resp 16 | Ht 65.5 in | Wt 120.2 lb

## 2011-11-10 DIAGNOSIS — C50419 Malignant neoplasm of upper-outer quadrant of unspecified female breast: Secondary | ICD-10-CM

## 2011-11-10 NOTE — Progress Notes (Signed)
Subjective:     Patient ID: Tricia [name], female   DOB: July 24, 1925, 78 y.o.   MRN: [id number]  HPI  This 78 year old and underwent left total mastectomy on 10/21/2011. Final pathology shows 3 separate foci of invasive ductal carcinoma, largest focus 1.1 cm, margins are clear. ER 99%, PR 8%, HER-2-negative, Ki-67 9%. Final stage by ypT1c, ypNx.Marland Kitchen  She had been on neoadjuvant antiestrogen therapy for several months. She has had a difficult time making decisions as did her husband due to incomplete insight. She has memory problems as well. Her sons have been helping the process along.  She continues to smoke, which I discouraged.  She is feeling okay. The drainage has subsided. They did not get very good records.  Review of Systems     Objective:   Physical Exam Patient looks well. In no distress.  Left mastectomy incision is healing nicely. There is no infection. No skin necrosis. The drainage is serosanguineous. The skin flaps appeared to be adherent to the chest wall, and so I removed the drain. Range of motion left shoulder is very good. There is no arm swelling.    Assessment:     Invasive ductal carcinoma left breast, multifocal, receptor positive, Ki 67 9%. Pathologic stage ypT1c, ypNx.  Doing well in the postop period without obvious complication.  Tobacco abuse    Plan:     Wound care instructions given. She may shower starting tomorrow. Range of motion exercises encouraged.  Smoking was discouraged.  She was advised to make an appointment to see Dr. Pierce Crane  She was advised to continue Femara.  Return to see me in one month.   Angelia Mould. Derrell Lolling, M.D., [facility] General and Minimally invasive Surgery Breast and Colorectal Surgery Office:   [phone] Pager:   [phone]

## 2011-11-10 NOTE — Patient Instructions (Signed)
The drain was removed today. You may start taking a shower tomorrow.  We will help you make an appointment to see Dr. Pierce Crane.  Continue to take the Femara, which is the pill that Dr. Donnie Coffin gave you to treat the breast cancer.  You will return to see Dr. Derrell Lolling in one month.  Call Dr. Derrell Lolling if you develop any evidence of infection or swelling.

## 2011-11-14 ENCOUNTER — Telehealth: Payer: Self-pay | Admitting: *Deleted

## 2011-11-14 NOTE — Telephone Encounter (Signed)
md out of the office on 12-18-2011 moved patient appointment to 12-22-2011

## 2011-12-09 ENCOUNTER — Encounter (INDEPENDENT_AMBULATORY_CARE_PROVIDER_SITE_OTHER): Payer: Self-pay | Admitting: General Surgery

## 2011-12-09 NOTE — Progress Notes (Signed)
Faxed signed authorization for plan of care/plan of treatment to the attention of Dellis Anes of [facility]. Fax # [phone]. Sent to medical records to be scanned into chart.

## 2011-12-11 ENCOUNTER — Encounter (INDEPENDENT_AMBULATORY_CARE_PROVIDER_SITE_OTHER): Payer: Self-pay | Admitting: General Surgery

## 2011-12-11 ENCOUNTER — Ambulatory Visit (INDEPENDENT_AMBULATORY_CARE_PROVIDER_SITE_OTHER): Payer: Medicare Other | Admitting: General Surgery

## 2011-12-11 VITALS — BP 124/68 | HR 70 | Temp 97.3°F | Resp 16 | Ht 65.5 in | Wt 119.2 lb

## 2011-12-11 DIAGNOSIS — C50419 Malignant neoplasm of upper-outer quadrant of unspecified female breast: Secondary | ICD-10-CM

## 2011-12-11 NOTE — Progress Notes (Signed)
Patient ID: Tricia [name], female   DOB: 08-17-1925, 78 y.o.   MRN: [id number]  History: This patient returns for a postop visit following left mastectomy. She underwent left total mastectomy on 10/21/2011. She had received several months of neoadjuvant antiestrogen therapy. Final pathology shows 3 separate foci of invasive ductal carcinoma, largest focus 1.1 cm, margins are clear. ER 99%, PR 8%, HER-2-negative, Ki-67 9%. Final stage by ypT1c, ypNx.Marland Kitchen  She had been on neoadjuvant antiestrogen therapy for several months. She  had a difficult time making decisions as did her husband due to incomplete insight. She has memory problems as well. Her sons have been helping the process along.  She continues to smoke, which I discouraged. She has no complaints about her left mastectomy wound. She has appointment with Dr. Donnie Coffin on September 23.  Exam: Patient looks well. Alert. Incomplete inside. No distress. Left mastectomy wound is healing without any complication. She has a little bit of redundant fatty tissue but no infection, no skin necrosis, and no residual fluid collection. Range of motion left shoulder is almost completely just a little bit tight at about 170.  Assessment: Multifocal invasive ductal carcinoma left breast, receptor positive, HER-2-negative. Final pathologic stage ypT1c, ypNX. Recovering uneventfully following left total mastectomy  Plan: Range of motion exercises left shoulder encouraged. Smoking cessation encouraged See  Dr. Donnie Coffin September 23 Return to see me 6 months Mammograms in one year of the right breast.    Angelia Mould. Derrell Lolling, M.D., [facility] General and Minimally invasive Surgery Breast and Colorectal Surgery Office:   [phone] Pager:   [phone]

## 2011-12-11 NOTE — Patient Instructions (Signed)
Your left mastectomy wound is healing without any obvious surgical complications. There is no infection or fluid collection.  Be sure to continue your left shoulder exercises and stretch it out as much as possible several times a day.  Be sure to keep your appointment with Dr. Pierce Crane on September 23.  Return to see Dr. Derrell Lolling in 6 months.

## 2011-12-18 ENCOUNTER — Other Ambulatory Visit: Payer: Medicare Other | Admitting: Lab

## 2011-12-18 ENCOUNTER — Ambulatory Visit: Payer: Medicare Other | Admitting: Oncology

## 2011-12-22 ENCOUNTER — Other Ambulatory Visit: Payer: Medicare Other | Admitting: Lab

## 2011-12-22 ENCOUNTER — Ambulatory Visit: Payer: Medicare Other | Admitting: Oncology

## 2011-12-23 ENCOUNTER — Telehealth: Payer: Self-pay | Admitting: *Deleted

## 2011-12-23 NOTE — Telephone Encounter (Signed)
PATIENT CONFIRMED OVER THE PHONE THE NEW DATE AND TIME ON 01-29-2012 STARTING AT 2:30PM

## 2012-01-29 ENCOUNTER — Ambulatory Visit: Payer: Medicare Other | Admitting: Oncology

## 2012-01-29 ENCOUNTER — Other Ambulatory Visit: Payer: Medicare Other | Admitting: Lab

## 2012-01-30 ENCOUNTER — Telehealth: Payer: Self-pay | Admitting: *Deleted

## 2012-01-30 NOTE — Telephone Encounter (Signed)
Called patient and informed the patient of the new date and time of the missed appointment from 01-29-2012

## 2012-02-05 ENCOUNTER — Telehealth: Payer: Self-pay | Admitting: Oncology

## 2012-02-05 NOTE — Telephone Encounter (Signed)
Pt went to the wrong office for her appt and her appt was r/s for December. gve the pt her dec 2013 appt calendar

## 2012-03-30 ENCOUNTER — Telehealth: Payer: Self-pay | Admitting: *Deleted

## 2012-03-30 ENCOUNTER — Ambulatory Visit (HOSPITAL_BASED_OUTPATIENT_CLINIC_OR_DEPARTMENT_OTHER): Payer: Medicare Other | Admitting: Oncology

## 2012-03-30 ENCOUNTER — Other Ambulatory Visit (HOSPITAL_BASED_OUTPATIENT_CLINIC_OR_DEPARTMENT_OTHER): Payer: Medicare Other | Admitting: Lab

## 2012-03-30 VITALS — BP 166/88 | HR 80 | Temp 98.0°F | Resp 20 | Ht 65.5 in | Wt 114.8 lb

## 2012-03-30 DIAGNOSIS — Z170 Estrogen receptor positive status [ER+]: Secondary | ICD-10-CM

## 2012-03-30 DIAGNOSIS — C50419 Malignant neoplasm of upper-outer quadrant of unspecified female breast: Secondary | ICD-10-CM

## 2012-03-30 DIAGNOSIS — E559 Vitamin D deficiency, unspecified: Secondary | ICD-10-CM

## 2012-03-30 NOTE — Telephone Encounter (Signed)
Gave patient instructions on getting her 2014 appointments

## 2012-03-30 NOTE — Progress Notes (Signed)
Hematology and Oncology Follow Up Visit  Tricia [name] [id number] 1925-05-12 78 y.o. 03/30/2012 2:25 PM PCPDr Derrell Lolling   Principle Diagnosis:  22 -year-old with a history of locally advanced ER PR positive breast cancer on Femara since August 2012., s/p lumpectomy 10/21/11 for residual yPT1C N0 breast cancer, on ongoing femara.  Interim History:  As noted the pt had surgery in July which showed a number of small tumor foci, largext 1.1 cm , all had similar prognostic panels which were strongly er and pr+ , and her2-ve., with a low Ki67. Radiation therapy was not given. Unfortunately her husband has been admitted with renal failure and is currently in the ICU .  Medications: I have reviewed the patient's current medications.  Allergies: No Known Allergies  Past Medical History, Surgical history, Social history, and Family History were reviewed and updated.  Review of Systems: Constitutional:  Negative for fever, chills, night sweats, anorexia, weight loss, pain. Cardiovascular: no chest pain or dyspnea on exertion Respiratory: no cough, shortness of breath, or wheezing Neurological: no TIA or stroke symptoms Dermatological: negative ENT: negative Skin Gastrointestinal: no abdominal pain, change in bowel habits, or black or bloody stools Genito-Urinary: negative Hematological and Lymphatic: negative Breast: negative Musculoskeletal: negative Remaining ROS negative.  Physical Exam: Blood pressure 166/88, pulse 80, temperature 98 F (36.7 C), temperature source Oral, resp. rate 20, height 5' 5.5" (1.664 m), weight 114 lb 12.8 oz (52.073 kg). ECOG: 1 General appearance: alert, cooperative and appears stated age Head: Normocephalic, without obvious abnormality, atraumatic Neck: no adenopathy, no carotid bruit, no JVD, supple, symmetrical, trachea midline and thyroid not enlarged, symmetric, no tenderness/mass/nodules Lymph nodes: Cervical, supraclavicular, and axillary nodes  normal. Cardiac : regular rate and rhythm, no murmurs or gallops Pulmonary:clear to auscultation bilaterally and normal percussion bilaterally Breasts: inspection negative, no nipple discharge or bleeding, no masses or nodularity palpable and The left breast has a palpable mass in the upper-outer quadrant and a smaller than on previous exam is difficult to really assess exact dimensions. There is thickening in the general area so it makes a careful assessment more challenging. Abdomen:soft, non-tender; bowel sounds normal; no masses,  no organomegaly Extremities negative Neuro: alert, oriented, normal speech, no focal findings or movement disorder noted  Lab Results: Lab Results  Component Value Date   WBC 6.4 10/22/2011   HGB 13.2 10/22/2011   HCT 37.5 10/22/2011   MCV 91.9 10/22/2011   PLT 135* 10/22/2011     Chemistry      Component Value Date/Time   NA 136 10/14/2011 1010   K 4.1 10/14/2011 1010   CL 97 10/14/2011 1010   CO2 32 10/14/2011 1010   BUN 15 10/14/2011 1010   CREATININE 0.65 10/21/2011 1330      Component Value Date/Time   CALCIUM 9.6 10/14/2011 1010   ALKPHOS 36* 10/14/2011 1010   AST 18 10/14/2011 1010   ALT 14 10/14/2011 1010   BILITOT 0.7 10/14/2011 1010      .pathology. Radiological Studies: chest X-ray  Mammogram/MRI Findings: The enhancing mass located within the left upper-outer  quadrant (two o'clock position) has decreased in size and the  degree of enhancement has decreased when compared to the prior  study. The mass now measures 5 x 5 x 5 mm in size and previously  measured 8 x 8 x 13 mm in size. There is marked decrease in the  enhancement of the medially located left breast mass (nine o'clock  position) which is no longer visible  on the subtracted views. On  the precontrast views, the mass remains visible and measures 6 x 6  x 5 mm in size. There are no new enhancing foci within either  breast. There is no evidence for internal mammary or axillary   adenopathy and no metastatic foci are present. There are no  additional findings.  IMPRESSION:  Interval decrease in the size and enhancement of the left breast  masses as discussed above. Otherwise, no significant change.  Bone density 8/12 DUAL X-RAY ABSORPTIOMETRY (DXA) FOR BONE MINERAL DENSITY  LEFT FEMUR (NECK)  Bone Mineral Density (BMD): 0.567 g/cm2  Young Adult T Score: -2.5  Z Score: -0.1  LEFT FOREARM (1/3 RADIUS)  Bone Mineral Density (BMD): 0.550 g/cm2  Young Adult T Score: -2.4  Z Score: 1.1   Impression and Plan: 78 yo woman with hx of locally advanced breast cancer s/p 16 months neoadjuvant hormonal therapy followed by surgery 7/13, with residual yPT1cN0 breast cancer  And no xrt on ongoing femara. She will be seen in 6 months with imaging studies.  More than 50% of the visit was spent in patient-related counselling   Pierce Crane, MD 12/31/20132:25 PM

## 2012-05-13 ENCOUNTER — Encounter (HOSPITAL_COMMUNITY): Payer: Self-pay | Admitting: *Deleted

## 2012-05-13 ENCOUNTER — Inpatient Hospital Stay (HOSPITAL_COMMUNITY)
Admission: EM | Admit: 2012-05-13 | Discharge: 2012-05-19 | DRG: 281 | Disposition: A | Discharge status: Skilled Nursing Facility | Payer: Medicare Other | Attending: Internal Medicine | Admitting: Internal Medicine

## 2012-05-13 ENCOUNTER — Emergency Department (HOSPITAL_COMMUNITY): Payer: Medicare Other

## 2012-05-13 DIAGNOSIS — F1010 Alcohol abuse, uncomplicated: Secondary | ICD-10-CM | POA: Diagnosis present

## 2012-05-13 DIAGNOSIS — I119 Hypertensive heart disease without heart failure: Secondary | ICD-10-CM | POA: Diagnosis present

## 2012-05-13 DIAGNOSIS — Z7982 Long term (current) use of aspirin: Secondary | ICD-10-CM

## 2012-05-13 DIAGNOSIS — F17200 Nicotine dependence, unspecified, uncomplicated: Secondary | ICD-10-CM | POA: Diagnosis present

## 2012-05-13 DIAGNOSIS — S0100XA Unspecified open wound of scalp, initial encounter: Secondary | ICD-10-CM | POA: Diagnosis present

## 2012-05-13 DIAGNOSIS — N179 Acute kidney failure, unspecified: Secondary | ICD-10-CM | POA: Diagnosis present

## 2012-05-13 DIAGNOSIS — R011 Cardiac murmur, unspecified: Secondary | ICD-10-CM | POA: Diagnosis present

## 2012-05-13 DIAGNOSIS — R220 Localized swelling, mass and lump, head: Secondary | ICD-10-CM | POA: Diagnosis present

## 2012-05-13 DIAGNOSIS — R404 Transient alteration of awareness: Secondary | ICD-10-CM | POA: Diagnosis present

## 2012-05-13 DIAGNOSIS — Z716 Tobacco abuse counseling: Secondary | ICD-10-CM

## 2012-05-13 DIAGNOSIS — Z853 Personal history of malignant neoplasm of breast: Secondary | ICD-10-CM

## 2012-05-13 DIAGNOSIS — C50419 Malignant neoplasm of upper-outer quadrant of unspecified female breast: Secondary | ICD-10-CM

## 2012-05-13 DIAGNOSIS — Z7189 Other specified counseling: Secondary | ICD-10-CM

## 2012-05-13 DIAGNOSIS — W19XXXA Unspecified fall, initial encounter: Secondary | ICD-10-CM

## 2012-05-13 DIAGNOSIS — I4891 Unspecified atrial fibrillation: Secondary | ICD-10-CM | POA: Diagnosis present

## 2012-05-13 DIAGNOSIS — S0101XA Laceration without foreign body of scalp, initial encounter: Secondary | ICD-10-CM

## 2012-05-13 DIAGNOSIS — E876 Hypokalemia: Secondary | ICD-10-CM | POA: Diagnosis present

## 2012-05-13 DIAGNOSIS — I959 Hypotension, unspecified: Principal | ICD-10-CM | POA: Diagnosis present

## 2012-05-13 DIAGNOSIS — I214 Non-ST elevation (NSTEMI) myocardial infarction: Secondary | ICD-10-CM | POA: Diagnosis present

## 2012-05-13 DIAGNOSIS — R55 Syncope and collapse: Secondary | ICD-10-CM

## 2012-05-13 DIAGNOSIS — R410 Disorientation, unspecified: Secondary | ICD-10-CM

## 2012-05-13 DIAGNOSIS — F0390 Unspecified dementia without behavioral disturbance: Secondary | ICD-10-CM | POA: Diagnosis present

## 2012-05-13 DIAGNOSIS — I421 Obstructive hypertrophic cardiomyopathy: Secondary | ICD-10-CM | POA: Diagnosis present

## 2012-05-13 DIAGNOSIS — Y92009 Unspecified place in unspecified non-institutional (private) residence as the place of occurrence of the external cause: Secondary | ICD-10-CM

## 2012-05-13 DIAGNOSIS — Z79899 Other long term (current) drug therapy: Secondary | ICD-10-CM

## 2012-05-13 DIAGNOSIS — R1310 Dysphagia, unspecified: Secondary | ICD-10-CM | POA: Diagnosis present

## 2012-05-13 DIAGNOSIS — W010XXA Fall on same level from slipping, tripping and stumbling without subsequent striking against object, initial encounter: Secondary | ICD-10-CM | POA: Diagnosis present

## 2012-05-13 LAB — POCT I-STAT, CHEM 8
Calcium, Ion: 1.07 mmol/L — ABNORMAL LOW (ref 1.13–1.30)
Glucose, Bld: 95 mg/dL (ref 70–99)
HCT: 47 % — ABNORMAL HIGH (ref 36.0–46.0)
Hemoglobin: 16 g/dL — ABNORMAL HIGH (ref 12.0–15.0)
Potassium: 3.9 mEq/L (ref 3.5–5.1)

## 2012-05-13 LAB — PROTIME-INR
INR: 0.98 (ref 0.00–1.49)
Prothrombin Time: 12.9 seconds (ref 11.6–15.2)

## 2012-05-13 LAB — CBC WITH DIFFERENTIAL/PLATELET
Basophils Absolute: 0 10*3/uL (ref 0.0–0.1)
Basophils Relative: 0 % (ref 0–1)
Hemoglobin: 15.5 g/dL — ABNORMAL HIGH (ref 12.0–15.0)
MCHC: 35.6 g/dL (ref 30.0–36.0)
Monocytes Relative: 6 % (ref 3–12)
Neutro Abs: 4.3 10*3/uL (ref 1.7–7.7)
Neutrophils Relative %: 67 % (ref 43–77)
RBC: 4.7 MIL/uL (ref 3.87–5.11)

## 2012-05-13 NOTE — H&P (Signed)
[facility] History and Physical  Tricia [name] ZOX:[id number] DOB: 08-11-1925 DOA: 05/13/2012  Referring physician: Dr. Hyacinth Meeker. PCP: [name], MD  Specialists: Dr. Caron Presume for oncology.  Chief Complaint: Fall.  HPI: Tricia [name] is a 79 y.o. female history of idiopathic hypertrophic subaortic stenosis, hypertension, tobacco abuse, cancer breast status post mastectomy presented to the ER after patient had a fall today at her house. Patient states around noontime she was walking towards the bathroom and she states she fell and hit her head. She sustained a small laceration but did not lose consciousness. She was not sure how she fell she states she may have slipped but not sure. CT head and C-spine was only showing hematoma and there left neck mass otherwise there was no fracture. EKG was in sinus rhythm with nonspecific changes. Cardiac enzymes were negative. Patient will be admitted for further observation given her history of cardiac disease. Patient denies any chest pain or shortness of breath fever chills nausea vomiting or diarrhea. Patient is nonfocal.  Review of Systems: The patient denies anorexia, fever, weight loss,, vision loss, decreased hearing, hoarseness, chest pain, syncope, dyspnea on exertion, peripheral edema, balance deficits, hemoptysis, abdominal pain, melena, hematochezia, severe indigestion/heartburn, hematuria, incontinence, genital sores, muscle weakness, suspicious skin lesions, transient blindness, difficulty walking, depression, unusual weight change, abnormal bleeding, enlarged lymph nodes, angioedema, and breast masses.  Past Medical History  Diagnosis Date  . Idiopathic hypertrophic subaortic stenosis   . Irregular heart beat   . Fibrocystic breast disease   . Tobacco abuse   . Hypertension   . History of colonoscopy   . Blood transfusion 10 yrs ago    after bleeding ulcer in stomach  . Heart murmur   . Cancer     left breast   Past  Surgical History  Procedure Laterality Date  . US echocardiography  12/05/2008    EF 60-65%  . US echocardiography  12/01/2007    EF 60-65%  . US echocardiography  07/01/2006    EF 55-60%  . US echocardiography  06/26/2005    EF 55-60%  . Cardiac catheterization  01/30/2002    EF 75-80%  . Appendectomy  1946  . Right breast benign lump removed  yrs ago  . Breast surgery  10/21/11    simple mastectomy   Social History:  reports that she has been smoking Cigarettes.  She has a 20 pack-year smoking history. She has never used smokeless tobacco. She reports that  drinks alcohol. She reports that she does not use illicit drugs. Lives at home. where does patient live--home, ALF, SNF? and with whom if at home? Yes Can patient participate in ADLs?  No Known Allergies  Family History  Problem Relation Age of Onset  . Heart attack Mother   . Breast cancer Mother   . Cancer Mother     breast  . Diabetes Father   . Stomach cancer Brother   . Cancer Brother     colon     Prior to Admission medications   Medication Sig Start Date End Date Taking? Authorizing Provider  alendronate (FOSAMAX) 70 MG tablet Take 70 mg by mouth every 7 (seven) days. On Thursdays.  Take with a full glass of water on an empty stomach. 09/16/11 09/15/12 Yes Pierce Crane, MD  aspirin 81 MG tablet Take 81 mg by mouth every other day.    Yes Historical Provider, MD  calcium-vitamin D (OSCAL WITH D) 500-200 MG-UNIT per tablet Take 1 tablet by  mouth every evening.   Yes Historical Provider, MD  donepezil (ARICEPT) 5 MG tablet Take 5 mg by mouth at bedtime.  08/30/11  Yes Historical Provider, MD  glucosamine-chondroitin 500-400 MG tablet Take 1 tablet by mouth daily.    Yes Historical Provider, MD  metoprolol succinate (TOPROL XL) 100 MG 24 hr tablet Take 100 mg by mouth daily with breakfast.  01/11/11  Yes [name], MD  Multiple Vitamin (MULTIVITAMIN) tablet Take 1 tablet by mouth daily.     Yes Historical Provider, MD    Physical Exam: Filed Vitals:   05/13/12 1921  BP: 163/102  Pulse: 83  Temp: 97.7 F (36.5 C)  TempSrc: Oral  SpO2: 98%     General:  Moderately built and nourished.  Eyes: Anicteric no pallor.  ENT: No obvious mass. No discharge from the ears some nose or mouth. Patient has scalp laceration which has been sutured.  Neck: No obvious mass.  Cardiovascular: S1-S2 heard.  Respiratory: Bilateral air entry present no rhonchi no crepitation.  Abdomen: Soft nontender bowel sounds heard.  Skin: Scalp laceration. This has been sutured.  Musculoskeletal: Nontender.  Psychiatric: Appears normal.  Neurologic: Alert awake oriented to time place and person. Moves upper and lower extremities 5 x 5.  Labs on Admission:  Basic Metabolic Panel:  Recent Labs Lab 05/13/12 2030  NA 136  K 3.9  CL 102  GLUCOSE 95  BUN 14  CREATININE 1.00   Liver Function Tests: No results found for this basename: AST, ALT, ALKPHOS, BILITOT, PROT, ALBUMIN,  in the last 168 hours No results found for this basename: LIPASE, AMYLASE,  in the last 168 hours No results found for this basename: AMMONIA,  in the last 168 hours CBC:  Recent Labs Lab 05/13/12 2010 05/13/12 2030  WBC 6.3  --   NEUTROABS 4.3  --   HGB 15.5* 16.0*  HCT 43.6 47.0*  MCV 92.8  --   PLT 160  --    Cardiac Enzymes: No results found for this basename: CKTOTAL, CKMB, CKMBINDEX, TROPONINI,  in the last 168 hours  BNP (last 3 results) No results found for this basename: PROBNP,  in the last 8760 hours CBG: No results found for this basename: GLUCAP,  in the last 168 hours  Radiological Exams on Admission: Ct Head Wo Contrast  05/13/2012  *RADIOLOGY REPORT*  Clinical Data:  .  Follow-up.  Lightheadedness.  CT HEAD WITHOUT CONTRAST CT CERVICAL SPINE WITHOUT CONTRAST  Technique:  Multidetector CT imaging of the head and cervical spine was performed following the standard protocol without intravenous contrast.   Multiplanar CT image reconstructions of the cervical spine were also generated.  Comparison:  05/01/2011 head CT.  No comparison cervical spine exam.  CT HEAD  Findings: Left parietal scalp hematoma without underlying fracture or intracranial hemorrhage.  Significant small vessel disease type changes without CT evidence of large acute infarct.  Global atrophy.  Ventricular prominence without change.  No intracranial mass lesion detected on this unenhanced exam.  Vascular calcifications.  Mild exophthalmos.  IMPRESSION: Left parietal scalp hematoma without underlying fracture or intracranial hemorrhage.  CT CERVICAL SPINE  Findings: No cervical spine fracture.  Fusion C2-3.  Cervical spondylotic changes with various degrees of spinal stenosis and foraminal narrowing.  Transverse ligament hypertrophy.  No abnormal prevertebral soft tissue swelling.  1.9 cm mass in the left vallecula/piriform sinus region which appears hyperdense and is displacing adjacent air column extending to the level of the vocal cords.  Etiology indeterminate.  It is possible this represents a benign process such as a proteinaceous vallecula cyst however, mucosal abnormality not excluded.  Prominent carotid bifurcation calcifications.  No lung apical pneumothorax.  IMPRESSION:  No cervical spine fracture.  Fusion C2-3.  Cervical spondylotic changes with various degrees of spinal stenosis and foraminal narrowing.  Transverse ligament hypertrophy.  1.9 cm mass in the left vallecula/piriform sinus region which appears hyperdense and is displacing adjacent air column extending to the level of the vocal cords.  Etiology indeterminate.  It is possible this represents a benign process such as a proteinaceous vallecula cyst however, mucosal abnormality not excluded.   Original Report Authenticated By: Lacy Duverney, M.D.    Ct Cervical Spine Wo Contrast  05/13/2012  *RADIOLOGY REPORT*  Clinical Data:  .  Follow-up.  Lightheadedness.  CT HEAD WITHOUT  CONTRAST CT CERVICAL SPINE WITHOUT CONTRAST  Technique:  Multidetector CT imaging of the head and cervical spine was performed following the standard protocol without intravenous contrast.  Multiplanar CT image reconstructions of the cervical spine were also generated.  Comparison:  05/01/2011 head CT.  No comparison cervical spine exam.  CT HEAD  Findings: Left parietal scalp hematoma without underlying fracture or intracranial hemorrhage.  Significant small vessel disease type changes without CT evidence of large acute infarct.  Global atrophy.  Ventricular prominence without change.  No intracranial mass lesion detected on this unenhanced exam.  Vascular calcifications.  Mild exophthalmos.  IMPRESSION: Left parietal scalp hematoma without underlying fracture or intracranial hemorrhage.  CT CERVICAL SPINE  Findings: No cervical spine fracture.  Fusion C2-3.  Cervical spondylotic changes with various degrees of spinal stenosis and foraminal narrowing.  Transverse ligament hypertrophy.  No abnormal prevertebral soft tissue swelling.  1.9 cm mass in the left vallecula/piriform sinus region which appears hyperdense and is displacing adjacent air column extending to the level of the vocal cords.  Etiology indeterminate.  It is possible this represents a benign process such as a proteinaceous vallecula cyst however, mucosal abnormality not excluded.  Prominent carotid bifurcation calcifications.  No lung apical pneumothorax.  IMPRESSION:  No cervical spine fracture.  Fusion C2-3.  Cervical spondylotic changes with various degrees of spinal stenosis and foraminal narrowing.  Transverse ligament hypertrophy.  1.9 cm mass in the left vallecula/piriform sinus region which appears hyperdense and is displacing adjacent air column extending to the level of the vocal cords.  Etiology indeterminate.  It is possible this represents a benign process such as a proteinaceous vallecula cyst however, mucosal abnormality not excluded.    Original Report Authenticated By: Lacy Duverney, M.D.     EKG: Independently reviewed. Normal sinus rhythm.  Assessment/Plan Principal Problem:   Near syncope Active Problems:   IHSS (idiopathic hypertrophic subaortic stenosis)   Benign hypertensive heart disease without heart failure   Scalp laceration   1. Near-syncope versus mechanical fall - at this time given her history of cardiac disease (idiopathic hypertrophic subaortic stenosis) we will rule out any arrhythmias by monitoring in the telemetry. Check orthostatics in a.m. May discuss with patient's cardiologist in a.m. Dr. Patty Sermons. 2. Scalp laceration status post fall - this has been sutured.  3. Left neck mass - CT of the C-spine shows left neck mass and this will require further workup. Patient was advised that she will need further workup as outpatient with an ENT. 4. Tobacco and alcohol abuse - strongly advised to quit. Patient's alcohol withdrawal protocol on when necessary Ativan. 5. Uncontrolled hypertension - in addition to  her home medications patient is on when necessary IV hydralazine for systolic blood pressure more than 160. 6. Cancer breast status post left-sided mastectomy - per oncology.  No known consulted. if consultant consulted, please document name and whether formally or informally consulted  Code Status:  full code. (must indicate code status--if unknown or must be presumed, indicate so) Family Communication:  none at the bedside. (indicate person spoken with, if applicable, with phone number if by telephone) Disposition Plan:  admit to inpatient. (indicate anticipated LOS)   Tabitha Tupper N. [facility] Pager [phone]  If 7PM-7AM, please contact night-coverage [email or web address] Password [facility] 05/13/2012, 11:30 PM

## 2012-05-13 NOTE — ED Notes (Signed)
Patient is alert and oriented x3.  She states that she fell in the bathroom.   She denies any loss of consciousness, lightheadedness, blurred vision or pain

## 2012-05-13 NOTE — ED Provider Notes (Signed)
LACERATION REPAIR Performed by: Dorthula Matas Authorized by: Dorthula Matas Consent: Verbal consent obtained. Risks and benefits: risks, benefits and alternatives were discussed Consent given by: patient Patient identity confirmed: provided demographic data Prepped and Draped in normal sterile fashion Wound explored  Laceration Location: right parietal scalp  Laceration Length: 2cm  No Foreign Bodies seen or palpated  Anesthesia: local infiltration  Local anesthetic: lidocaine 2% wo epinephrine  Anesthetic total: 3 ml  Irrigation method: syringe Amount of cleaning: standard  Skin closure: staples  Number of stalpes: 2  Technique: stapler  Patient tolerance: Patient tolerated the procedure well with no immediate complications.   FOR HPI, ROS, PE, and TREATMENT/PLAN please see DR. Marshell Levan note from today.   Dorthula Matas, PA 05/13/12 2152

## 2012-05-13 NOTE — ED Provider Notes (Signed)
History     CSN: [id number]  Arrival date & time 05/13/12  9147   First MD Initiated Contact with Patient 05/13/12 1926      Chief Complaint  Patient presents with  . Fall  . Head Laceration    right side    (Consider location/radiation/quality/duration/timing/severity/associated sxs/prior treatment) HPI Comments: Pt was found in the bathroom when she fell and struck her head.  Her son found her on the floor with an injury to her head.  She remembers going to the bathroom, but is unsure what happened next.  She has mild headache, no neck pain, no visual changes, CP, palpitations or dyspnea.  She has had no swelling, rash, diarrhea or any other c/o.  She been drinking ETOH this evening.  At this time, she has no c/o.  This occurred just prior to arrival and was acute in onset.  She states she recently one month ago lost her husband.    Patient is a 79 y.o. female presenting with fall and scalp laceration. The history is provided by the patient and the EMS personnel.  Fall  Head Laceration    Past Medical History  Diagnosis Date  . Idiopathic hypertrophic subaortic stenosis   . Irregular heart beat   . Fibrocystic breast disease   . Tobacco abuse   . Hypertension   . History of colonoscopy   . Blood transfusion 10 yrs ago    after bleeding ulcer in stomach  . Heart murmur   . Cancer     left breast    Past Surgical History  Procedure Laterality Date  . US echocardiography  12/05/2008    EF 60-65%  . US echocardiography  12/01/2007    EF 60-65%  . US echocardiography  07/01/2006    EF 55-60%  . US echocardiography  06/26/2005    EF 55-60%  . Cardiac catheterization  01/30/2002    EF 75-80%  . Appendectomy  1946  . Right breast benign lump removed  yrs ago  . Breast surgery  10/21/11    simple mastectomy    Family History  Problem Relation Age of Onset  . Heart attack Mother   . Breast cancer Mother   . Cancer Mother     breast  . Diabetes Father   . Stomach  cancer Brother   . Cancer Brother     colon    History  Substance Use Topics  . Smoking status: Current Every Day Smoker -- 1.00 packs/day for 20 years    Types: Cigarettes  . Smokeless tobacco: Never Used  . Alcohol Use: Yes     Comment: 1.5 oz of rum in afternoon    OB History   Grav Para Term Preterm Abortions TAB SAB Ect Mult Living                  Review of Systems  All other systems reviewed and are negative.    Allergies  Review of patient's allergies indicates no known allergies.  Home Medications   Current Outpatient Rx  Name  Route  Sig  Dispense  Refill  . alendronate (FOSAMAX) 70 MG tablet   Oral   Take 70 mg by mouth every 7 (seven) days. On Thursdays.  Take with a full glass of water on an empty stomach.         Marland Kitchen aspirin 81 MG tablet   Oral   Take 81 mg by mouth every other day.          Marland Kitchen  calcium-vitamin D (OSCAL WITH D) 500-200 MG-UNIT per tablet   Oral   Take 1 tablet by mouth every evening.         . donepezil (ARICEPT) 5 MG tablet   Oral   Take 5 mg by mouth at bedtime.          Marland Kitchen glucosamine-chondroitin 500-400 MG tablet   Oral   Take 1 tablet by mouth daily.          . metoprolol succinate (TOPROL XL) 100 MG 24 hr tablet   Oral   Take 100 mg by mouth daily with breakfast.          . Multiple Vitamin (MULTIVITAMIN) tablet   Oral   Take 1 tablet by mouth daily.             BP 163/102  Pulse 83  Temp(Src) 97.7 F (36.5 C) (Oral)  SpO2 98%  Physical Exam  Nursing note and vitals reviewed. Constitutional: She appears well-developed and well-nourished. No distress.  HENT:  Head: Normocephalic.  Mouth/Throat: Oropharynx is clear and moist. No oropharyngeal exudate.  Laceration to the crown of head on left.  no facial tenderness, deformity, malocclusion or hemotympanum.  no battle's sign or racoon eyes.   Eyes: Conjunctivae and EOM are normal. Pupils are equal, round, and reactive to light. Right eye exhibits no  discharge. Left eye exhibits no discharge. No scleral icterus.  Neck: No JVD present. No thyromegaly present.  Cardiovascular: Normal rate, regular rhythm and intact distal pulses.  Exam reveals no gallop and no friction rub.   Murmur ( 3/6 systolic murmur) heard. Pulmonary/Chest: Effort normal and breath sounds normal. No respiratory distress. She has no wheezes. She has no rales.  Abdominal: Soft. Bowel sounds are normal. She exhibits no distension and no mass. There is no tenderness.  Musculoskeletal: Normal range of motion. She exhibits no edema and no tenderness.  Lymphadenopathy:    She has no cervical adenopathy.  Neurological: She is alert. Coordination normal.  Speaks in clear sentences, moves all extremities without difficulty, no numbness or weakness, normal sensation to light touch to all 4 extremities.  Normal CN 3-12  Skin: Skin is warm and dry. No rash noted. No erythema.  Psychiatric: She has a normal mood and affect. Her behavior is normal.    ED Course  Procedures (including critical care time)  Labs Reviewed  CBC WITH DIFFERENTIAL - Abnormal; Notable for the following:    Hemoglobin 15.5 (*)    All other components within normal limits  ETHANOL - Abnormal; Notable for the following:    Alcohol, Ethyl (B) 134 (*)    All other components within normal limits  POCT I-STAT, CHEM 8 - Abnormal; Notable for the following:    Calcium, Ion 1.07 (*)    Hemoglobin 16.0 (*)    HCT 47.0 (*)    All other components within normal limits  APTT  PROTIME-INR  POCT I-STAT TROPONIN I   Ct Head Wo Contrast  05/13/2012  *RADIOLOGY REPORT*  Clinical Data:  .  Follow-up.  Lightheadedness.  CT HEAD WITHOUT CONTRAST CT CERVICAL SPINE WITHOUT CONTRAST  Technique:  Multidetector CT imaging of the head and cervical spine was performed following the standard protocol without intravenous contrast.  Multiplanar CT image reconstructions of the cervical spine were also generated.  Comparison:   05/01/2011 head CT.  No comparison cervical spine exam.  CT HEAD  Findings: Left parietal scalp hematoma without underlying fracture or intracranial hemorrhage.  Significant  small vessel disease type changes without CT evidence of large acute infarct.  Global atrophy.  Ventricular prominence without change.  No intracranial mass lesion detected on this unenhanced exam.  Vascular calcifications.  Mild exophthalmos.  IMPRESSION: Left parietal scalp hematoma without underlying fracture or intracranial hemorrhage.  CT CERVICAL SPINE  Findings: No cervical spine fracture.  Fusion C2-3.  Cervical spondylotic changes with various degrees of spinal stenosis and foraminal narrowing.  Transverse ligament hypertrophy.  No abnormal prevertebral soft tissue swelling.  1.9 cm mass in the left vallecula/piriform sinus region which appears hyperdense and is displacing adjacent air column extending to the level of the vocal cords.  Etiology indeterminate.  It is possible this represents a benign process such as a proteinaceous vallecula cyst however, mucosal abnormality not excluded.  Prominent carotid bifurcation calcifications.  No lung apical pneumothorax.  IMPRESSION:  No cervical spine fracture.  Fusion C2-3.  Cervical spondylotic changes with various degrees of spinal stenosis and foraminal narrowing.  Transverse ligament hypertrophy.  1.9 cm mass in the left vallecula/piriform sinus region which appears hyperdense and is displacing adjacent air column extending to the level of the vocal cords.  Etiology indeterminate.  It is possible this represents a benign process such as a proteinaceous vallecula cyst however, mucosal abnormality not excluded.   Original Report Authenticated By: Lacy Duverney, M.D.    Ct Cervical Spine Wo Contrast  05/13/2012  *RADIOLOGY REPORT*  Clinical Data:  .  Follow-up.  Lightheadedness.  CT HEAD WITHOUT CONTRAST CT CERVICAL SPINE WITHOUT CONTRAST  Technique:  Multidetector CT imaging of the head  and cervical spine was performed following the standard protocol without intravenous contrast.  Multiplanar CT image reconstructions of the cervical spine were also generated.  Comparison:  05/01/2011 head CT.  No comparison cervical spine exam.  CT HEAD  Findings: Left parietal scalp hematoma without underlying fracture or intracranial hemorrhage.  Significant small vessel disease type changes without CT evidence of large acute infarct.  Global atrophy.  Ventricular prominence without change.  No intracranial mass lesion detected on this unenhanced exam.  Vascular calcifications.  Mild exophthalmos.  IMPRESSION: Left parietal scalp hematoma without underlying fracture or intracranial hemorrhage.  CT CERVICAL SPINE  Findings: No cervical spine fracture.  Fusion C2-3.  Cervical spondylotic changes with various degrees of spinal stenosis and foraminal narrowing.  Transverse ligament hypertrophy.  No abnormal prevertebral soft tissue swelling.  1.9 cm mass in the left vallecula/piriform sinus region which appears hyperdense and is displacing adjacent air column extending to the level of the vocal cords.  Etiology indeterminate.  It is possible this represents a benign process such as a proteinaceous vallecula cyst however, mucosal abnormality not excluded.  Prominent carotid bifurcation calcifications.  No lung apical pneumothorax.  IMPRESSION:  No cervical spine fracture.  Fusion C2-3.  Cervical spondylotic changes with various degrees of spinal stenosis and foraminal narrowing.  Transverse ligament hypertrophy.  1.9 cm mass in the left vallecula/piriform sinus region which appears hyperdense and is displacing adjacent air column extending to the level of the vocal cords.  Etiology indeterminate.  It is possible this represents a benign process such as a proteinaceous vallecula cyst however, mucosal abnormality not excluded.   Original Report Authenticated By: Lacy Duverney, M.D.      1. Syncope   2. Laceration of  scalp       MDM  Pt has a head injjry and has had syncope without any other sx or memory of what had happened.  She  is currently hypertensive, has no hypoxia or tachycardia or fever.  Question whether this was ETOH related or neuro / cardiac.  Labs and ECG pending. CT head and C spine  Labs unremarkable, ECG shows  ED ECG REPORT  I personally interpreted this EKG   Date: 05/13/2012   Rate: 79  Rhythm: normal sinus rhythm  QRS Axis: left  Intervals: PR prolonged  ST/T Wave abnormalities: normal  Conduction Disutrbances:first-degree A-V block   Narrative Interpretation: LVH present, prior Anteior MI  Old EKG Reviewed: none available  Pt agreeable to observation due to syncope with hx of IHSS>  See ML note for lac repair.      Vida Roller, MD 05/13/12 2209

## 2012-05-14 ENCOUNTER — Observation Stay (HOSPITAL_COMMUNITY): Payer: Medicare Other

## 2012-05-14 DIAGNOSIS — I959 Hypotension, unspecified: Principal | ICD-10-CM | POA: Diagnosis not present

## 2012-05-14 DIAGNOSIS — Y92009 Unspecified place in unspecified non-institutional (private) residence as the place of occurrence of the external cause: Secondary | ICD-10-CM

## 2012-05-14 DIAGNOSIS — I059 Rheumatic mitral valve disease, unspecified: Secondary | ICD-10-CM

## 2012-05-14 DIAGNOSIS — I4891 Unspecified atrial fibrillation: Secondary | ICD-10-CM

## 2012-05-14 DIAGNOSIS — W19XXXA Unspecified fall, initial encounter: Secondary | ICD-10-CM

## 2012-05-14 LAB — TROPONIN I
Troponin I: <0.3 ng/mL (ref <0.30)
Troponin I: 2.55 ng/mL (ref <0.30)
Troponin I: 4.12 ng/mL (ref <0.30)

## 2012-05-14 LAB — TSH: TSH: 0.941 u[IU]/mL (ref 0.350–4.500)

## 2012-05-14 LAB — BASIC METABOLIC PANEL
BUN: 12 mg/dL (ref 6–23)
CO2: 30 mEq/L (ref 19–32)
Calcium: 9.7 mg/dL (ref 8.4–10.5)
Creatinine, Ser: 0.65 mg/dL (ref 0.50–1.10)

## 2012-05-14 LAB — CBC
MCH: 32.2 pg (ref 26.0–34.0)
MCHC: 34.7 g/dL (ref 30.0–36.0)
MCV: 92.9 fL (ref 78.0–100.0)
Platelets: 169 10*3/uL (ref 150–400)
RDW: 13.8 % (ref 11.5–15.5)
WBC: 8.7 10*3/uL (ref 4.0–10.5)

## 2012-05-14 MED ORDER — HYDRALAZINE HCL 20 MG/ML IJ SOLN
10.0000 mg | Freq: Once | INTRAMUSCULAR | Status: AC
  Administered 2012-05-14: 10 mg via INTRAVENOUS
  Filled 2012-05-14: qty 0.5

## 2012-05-14 MED ORDER — LORAZEPAM 2 MG/ML IJ SOLN
1.0000 mg | Freq: Four times a day (QID) | INTRAMUSCULAR | Status: DC | PRN
  Administered 2012-05-15: 1 mg via INTRAVENOUS
  Filled 2012-05-14: qty 1

## 2012-05-14 MED ORDER — SODIUM CHLORIDE 0.9 % IV SOLN
INTRAVENOUS | Status: AC
  Administered 2012-05-14: 04:00:00 via INTRAVENOUS

## 2012-05-14 MED ORDER — DONEPEZIL HCL 5 MG PO TABS
5.0000 mg | ORAL_TABLET | Freq: Every day | ORAL | Status: DC
  Administered 2012-05-14 – 2012-05-18 (×5): 5 mg via ORAL
  Filled 2012-05-14 (×6): qty 1

## 2012-05-14 MED ORDER — THIAMINE HCL 100 MG/ML IJ SOLN
100.0000 mg | Freq: Every day | INTRAMUSCULAR | Status: DC
Start: 2012-05-14 — End: 2012-05-19
  Filled 2012-05-14 (×6): qty 1

## 2012-05-14 MED ORDER — ADULT MULTIVITAMIN W/MINERALS CH
1.0000 | ORAL_TABLET | Freq: Every day | ORAL | Status: DC
  Administered 2012-05-14 – 2012-05-19 (×6): 1 via ORAL
  Filled 2012-05-14 (×6): qty 1

## 2012-05-14 MED ORDER — SODIUM CHLORIDE 0.9 % IJ SOLN
3.0000 mL | Freq: Two times a day (BID) | INTRAMUSCULAR | Status: DC
  Administered 2012-05-14 – 2012-05-18 (×6): 3 mL via INTRAVENOUS

## 2012-05-14 MED ORDER — ONDANSETRON HCL 4 MG PO TABS: 4.0000 mg | ORAL_TABLET | Freq: Four times a day (QID) | ORAL | Status: DC | PRN

## 2012-05-14 MED ORDER — AMIODARONE LOAD VIA INFUSION
150.0000 mg | Freq: Once | INTRAVENOUS | Status: AC
  Administered 2012-05-14: 150 mg via INTRAVENOUS
  Filled 2012-05-14: qty 83.34

## 2012-05-14 MED ORDER — METOPROLOL SUCCINATE ER 100 MG PO TB24
100.0000 mg | ORAL_TABLET | Freq: Every day | ORAL | Status: DC
  Filled 2012-05-14 (×2): qty 1

## 2012-05-14 MED ORDER — AMIODARONE HCL IN DEXTROSE 360-4.14 MG/200ML-% IV SOLN
30.0000 mg/h | INTRAVENOUS | Status: DC
  Administered 2012-05-14 (×2): 30 mg/h via INTRAVENOUS
  Filled 2012-05-14: qty 200

## 2012-05-14 MED ORDER — SODIUM CHLORIDE 0.9 % IJ SOLN
3.0000 mL | Freq: Two times a day (BID) | INTRAMUSCULAR | Status: DC
  Administered 2012-05-14 (×2): 3 mL via INTRAVENOUS
  Administered 2012-05-14: 11:00:00 via INTRAVENOUS
  Administered 2012-05-17: 3 mL via INTRAVENOUS

## 2012-05-14 MED ORDER — POTASSIUM CHLORIDE 20 MEQ PO PACK
40.0000 meq | PACK | Freq: Once | ORAL | Status: DC
  Filled 2012-05-14: qty 2

## 2012-05-14 MED ORDER — HYDRALAZINE HCL 20 MG/ML IJ SOLN
10.0000 mg | INTRAMUSCULAR | Status: DC | PRN
  Administered 2012-05-14: 10 mg via INTRAVENOUS
  Filled 2012-05-14: qty 1

## 2012-05-14 MED ORDER — ONDANSETRON HCL 4 MG/2ML IJ SOLN: 4.0000 mg | Freq: Four times a day (QID) | INTRAMUSCULAR | Status: DC | PRN

## 2012-05-14 MED ORDER — VITAMIN B-1 100 MG PO TABS
100.0000 mg | ORAL_TABLET | Freq: Every day | ORAL | Status: DC
Start: 2012-05-14 — End: 2012-05-19
  Administered 2012-05-14 – 2012-05-19 (×6): 100 mg via ORAL
  Filled 2012-05-14 (×6): qty 1

## 2012-05-14 MED ORDER — ASPIRIN 81 MG PO CHEW
81.0000 mg | CHEWABLE_TABLET | ORAL | Status: DC
  Filled 2012-05-14: qty 1

## 2012-05-14 MED ORDER — ACETAMINOPHEN 325 MG PO TABS
650.0000 mg | ORAL_TABLET | Freq: Four times a day (QID) | ORAL | Status: DC | PRN
  Administered 2012-05-15: 650 mg via ORAL
  Filled 2012-05-14: qty 2

## 2012-05-14 MED ORDER — AMIODARONE HCL IN DEXTROSE 360-4.14 MG/200ML-% IV SOLN
60.0000 mg/h | INTRAVENOUS | Status: AC
  Administered 2012-05-14 (×3): 60 mg/h via INTRAVENOUS
  Filled 2012-05-14 (×2): qty 200

## 2012-05-14 MED ORDER — SODIUM CHLORIDE 0.9 % IV BOLUS (SEPSIS)
500.0000 mL | Freq: Once | INTRAVENOUS | Status: AC
  Administered 2012-05-14: 500 mL via INTRAVENOUS

## 2012-05-14 MED ORDER — FOLIC ACID 1 MG PO TABS
1.0000 mg | ORAL_TABLET | Freq: Every day | ORAL | Status: DC
  Administered 2012-05-14 – 2012-05-19 (×6): 1 mg via ORAL
  Filled 2012-05-14 (×6): qty 1

## 2012-05-14 MED ORDER — MAGNESIUM SULFATE IN D5W 10-5 MG/ML-% IV SOLN
1.0000 g | Freq: Once | INTRAVENOUS | Status: AC
  Administered 2012-05-14: 1 g via INTRAVENOUS
  Filled 2012-05-14: qty 100

## 2012-05-14 MED ORDER — ACETAMINOPHEN 650 MG RE SUPP: 650.0000 mg | Freq: Four times a day (QID) | RECTAL | Status: DC | PRN

## 2012-05-14 MED ORDER — POTASSIUM CHLORIDE 20 MEQ/15ML (10%) PO LIQD
40.0000 meq | Freq: Once | ORAL | Status: AC
  Administered 2012-05-14: 40 meq via ORAL
  Filled 2012-05-14: qty 30

## 2012-05-14 MED ORDER — RIVAROXABAN 20 MG PO TABS
20.0000 mg | ORAL_TABLET | Freq: Every day | ORAL | Status: DC
  Administered 2012-05-15 – 2012-05-19 (×5): 20 mg via ORAL
  Filled 2012-05-14 (×7): qty 1

## 2012-05-14 MED ORDER — LORAZEPAM 1 MG PO TABS
1.0000 mg | ORAL_TABLET | Freq: Four times a day (QID) | ORAL | Status: DC | PRN
  Administered 2012-05-14 – 2012-05-15 (×3): 1 mg via ORAL
  Filled 2012-05-14 (×4): qty 1

## 2012-05-14 NOTE — Progress Notes (Signed)
CRITICAL VALUE ALERT  Critical value received: Toponin 2.55  Date of notification: 05/14/12 Time of notification: 1355  Critical value read back:  Nurse who received alert:  Redgie Grayer RN MD notified (1st page): Lucile Crater  Time of first page:  1350  MD notified (2nd page):  Responding MD:  same Time MD responded:  1355

## 2012-05-14 NOTE — Plan of Care (Signed)
Problem: Phase I Progression Outcomes Goal: Initial discharge plan identified Outcome: Completed/Met Date Met:  05/14/12 Plan to return home with son who lives with her

## 2012-05-14 NOTE — Plan of Care (Signed)
Pt confused at times.   OOB x 2 without calling for assistance bed alarm remains on... plan for family or sitter at bedside

## 2012-05-14 NOTE — Progress Notes (Signed)
Echocardiogram 2D Echocardiogram has been performed.  Rice Walsh 05/14/2012, 12:19 PM

## 2012-05-14 NOTE — Progress Notes (Signed)
[id number]/Rubby Barbary, RN, BSN, CCM:  CHART REVIEWED AND UPDATED.  Next chart review due on 02172014. NO DISCHARGE NEEDS PRESENT AT THIS TIME. CASE MANAGEMENT [phone] 

## 2012-05-14 NOTE — Consult Note (Signed)
CARDIOLOGY CONSULT NOTE  Patient ID: Tricia [name] MRN: [id number] DOB/AGE: Feb 19, 1926 79 y.o.  Admit date: 05/13/2012 Primary Cardiologist: Dr. Patty Sermons Reason for Consultation: Atrial fibrillation in the setting of hypertrophic cardiomyopathy  HPI:  79 yo with history of HTN, hypertrophic cardiomyopathy, and smoking presented to ER after fall. She apparently has some degree of dementia (on Aricept) but is a reasonably good historian.  Patient fell in her house and hit her head, so came to the ER.  She tripped on a carpet and fell as she was walking down steps into her living room.  No syncope.  No prior falls.  She sustained a head laceration but no severe injury.  Initially, she was in NSR upon arrival to hospital.  However, her BP was high in the hospital and she was given IV hydralazine.  Her BP then plummeted to the 70s systolic and she went into atrial fibrillation with RVR.  She has been asymptomatic. She does not feel tachypalpitations, chest pain, or dyspnea.  At baseline, she has not been feeling palpitations.  She is able to get around her house without dyspnea.  She has not been out a lot.  Recently lost her husband, son is now living with her.  HR now in the 100s with SBP in the 90s.    Per Dr. Yevonne Pax notes, patient has HCM.  I do not have an echo available to review.  She has been on Toprol XL at home. She still smokes and also drinks daily ETOH.  ETOH level was elevated at admission.   Review of systems complete and found to be negative unless listed above in HPI  Past Medical History: 1. Hypertrophic cardiomyopathy: I do not have an echo available to review.  She has been on Toprol XL long-term.  2. HTN 3. Breast cancer: s/p left mastectomy in 7/13.  She has been on hormonal treatment.  4. Dementia on Aricept 5. PUD: Distant past. 6. Atrial fibrillation: New diagnosis this admission.  7. Active smoker  Family History  Problem Relation Age of Onset  . Heart  attack Mother   . Breast cancer Mother   . Cancer Mother     breast  . Diabetes Father   . Stomach cancer Brother   . Cancer Brother     colon    History   Social History  . Marital Status: Married    Spouse Name: N/A    Number of Children: N/A  . Years of Education: N/A   Occupational History  . Not on file.   Social History Main Topics  . Smoking status: Current Every Day Smoker -- 1.00 packs/day for 20 years    Types: Cigarettes  . Smokeless tobacco: Never Used  . Alcohol Use: Yes     Comment: 1.5 oz of rum in afternoon  . Drug Use: No  . Sexually Active: Not on file   Other Topics Concern  . Not on file   Social History Narrative  . Widow, son lives with her.       Prescriptions prior to admission  Medication Sig Dispense Refill  . alendronate (FOSAMAX) 70 MG tablet Take 70 mg by mouth every 7 (seven) days. On Thursdays.  Take with a full glass of water on an empty stomach.      Marland Kitchen aspirin 81 MG tablet Take 81 mg by mouth every other day.       . calcium-vitamin D (OSCAL WITH D) 500-200 MG-UNIT per tablet Take  1 tablet by mouth every evening.      . donepezil (ARICEPT) 5 MG tablet Take 5 mg by mouth at bedtime.       Marland Kitchen glucosamine-chondroitin 500-400 MG tablet Take 1 tablet by mouth daily.       . metoprolol succinate (TOPROL XL) 100 MG 24 hr tablet Take 100 mg by mouth daily with breakfast.       . Multiple Vitamin (MULTIVITAMIN) tablet Take 1 tablet by mouth daily.         Current Medications . sodium chloride   Intravenous STAT  . aspirin  81 mg Oral QODAY  . donepezil  5 mg Oral QHS  . folic acid  1 mg Oral Daily  . multivitamin with minerals  1 tablet Oral Daily  . sodium chloride  3 mL Intravenous Q12H  . sodium chloride  3 mL Intravenous Q12H  . thiamine  100 mg Oral Daily   Or  . thiamine  100 mg Intravenous Daily     Physical exam Blood pressure 99/65, pulse 96, temperature 97.8 F (36.6 C), temperature source Oral, resp. rate 25, height 5'  5.5" (1.664 m), weight 112 lb 7 oz (51 kg), SpO2 97.00%. General: NAD Neck: No JVD, no thyromegaly or thyroid nodule.  Lungs: Decreased breath sounds bilaterally. CV: Nondisplaced PMI.  Heart irregular S1/S2, no S3/S4, 3/6 systolic murmur LLSB.  No peripheral edema.  No carotid bruit.   Abdomen: Soft, nontender, no hepatosplenomegaly, no distention.  Skin: Intact without lesions or rashes.  Neurologic: Alert and oriented x 3.  Psych: Normal affect. Extremities: No clubbing or cyanosis.  HEENT: Normal.   Labs:   Lab Results  Component Value Date   WBC 8.7 05/14/2012   HGB 15.8* 05/14/2012   HCT 45.5 05/14/2012   MCV 92.9 05/14/2012   PLT 169 05/14/2012    Recent Labs Lab 05/14/12 0433  NA 137  K 3.6  CL 97  CO2 30  BUN 12  CREATININE 0.65  CALCIUM 9.7  GLUCOSE 113*   Troponin < 0.3  Radiology:  - CXR: slight vascular congestion  EKG: - 2/13: NSR, LVH - 2/14: atrial fibrillation, LVH with new anterolateral ST depression  ASSESSMENT AND PLAN:  1. Fall: Seems to have been mechanical (no syncope).  She tripped on a carpet.  2. Atrial fibrillation: With RVR.  She went into atrial fibrillation this morning after getting IV hydralazine for elevated BP.  It is possible that use of a vasodilator (hydralazine) in the setting of HCM with LVOT obstruction led to increased LVOT gradient and abrupt fall in BP.  This may have triggered the atrial fibrillation.  She is unlikely to tolerate atrial fibrillation well if she has significant HCM => this is evidenced by continued low BP in the setting of atrial fibrillation this morning.   - Atrial fibrillation for < 24 hrs, poorly tolerated: I will start her on amiodarone via bolus and drip and will given her 1 mg IV magnesium sulfate.  If she does not convert to NSR overnight, would consider cardioversion in the am.  No TEE would be necessary as atrial fibrillation had a clear onset early this morning.  - CHADSVASC = 4.  She likely should be  anticoagulated.  She has only had one fall and is generally stable on her feet.  I will start Xarelto 20 mg daily (GFR 79) and make sure she will be able to obtain this medication (care coordination consult).  She should use a  cane at home.  3. Hypertrophic cardiomyopathy: Harsh systolic murmur along the sternal border is concerning for LV outflow tract obstruction.  No recent echo to review.   - Echo today. - Favor rhythm control method concerning her atrial fibrillation, as above, since she appears to tolerate atrial fibrillation poorly.  - Continue to push beta blockade (via amiodarone for now) to try to lessen LV outflow tract gradient.  - Would avoid pure vasodilators that drop afterload and increase LV outflow tract obstruction (such as hydralazine).  4. Smoking: Counseled to quit. 5. ETOH: Unsure of her actual intake.  She had a significant ETOH level at admission.  Watch for signs of withdrawal.   Marca Ancona 05/14/2012 9:34 AM

## 2012-05-14 NOTE — Progress Notes (Signed)
RRT note: Called to pt's room @0510  for SBP 70s. VS documented in flowsheets- first BP 80/48, monitor applied and found pt to be in afib with rvr(confirmed with ekg). Pt denies any chest pain; does report being lighthead/dizzy. NP notified by bedside RN- bolus given per orders and will be transferred to stepdown bed. BP improving- 92/50, HR 90s-110s. Will transport pt and continue to monitor.

## 2012-05-14 NOTE — Progress Notes (Signed)
Cardio consult note reviewed. Heart rate controlled on amiodarone. Repeat troponin this afternoon elevated to 2.5.  Patient denies any chest pain or discomfort. However she has been restless and trying to get out of bed. Discussed with cardiology consult over the phone again Ward [place]iv[place] , [place]). Recommended to follow serial cardiac enzymes and given the patient is already on Xeralto for anticoagulation would not place on heparin for now.  Will monitor serial CE .  Patient also informs taking 1 ounce of liquor every day. Signs of alcohol withdrawal. We'll place on CIWA.

## 2012-05-14 NOTE — Progress Notes (Addendum)
[facility] PROGRESS NOTE  Tricia [name] ZOX:[id number] DOB: [birth year] DOA: 05/13/2012 PCP: [name], MD  Assessment/Plan: Hypotension Patient was given a dose of hydralazine for elevated blood pressure on admission and subsequently dropped her blood pressure as low as 70s systolic.  She has a history of hypertrophic cardiomyopathy as per cardiology evaluation this morning she may have had a hypotensive episode due to further LV outflow tract obstruction due to afterload reduction with hydralazine. Blood pressure heart rate currently stable. Recommend avoiding afterload reducing agents  A. fib with RVR Likely triggered by her hypotensive episode. She has been started on amiodarone bolus followed by a drip. A 2-D echo has been ordered and we'll monitor. As per cardiology she does not convert into sinus overnight plan is for cardioversion in a.m. Her CHADS- vascular is 4 and started on Xeralto by cardiology.  Fall Mechanical. Has a right scalp laceration with staples placed in the ED. No active bleeding at this time. Monitor clinically. We'll place PT eval once heart rate better controlled and blood pressure stable.  EtOH use Patient denies heavy alcohol use however did have significant alcohol level on exam. We'll monitor closely for any withdrawal symptoms.  Dementia Appears mild. Continue with Namenda  Tobacco use Counseled on smoking cessation  Code Status: Full code Family Communication: None at bedside Disposition Plan: Home once stable. Continue step down monitoring for now.   Consultants:  Dr. Shirlee Latch [facility] cardiology)  Procedures:  4 2-D echo today  Antibiotics:  None  HPI/Subjective: Admission H&P reviewed. Patient became hypotensive after getting a dose of hydralazine for elevated BP and then went into A. fib. Started on IV fluids. EKG shows some ST depression in inferior leads. Patient transferred to step down for closer  monitoring.  Objective: Filed Vitals:   05/14/12 0620 05/14/12 0640 05/14/12 0700 05/14/12 0800  BP:  81/55 88/54 99/65   Pulse:  110 96 96  Temp:    97.8 F (36.6 C)  TempSrc:    Oral  Resp:  17 27 25   Height: 5' 5.5" (1.664 m)     Weight: 51 kg (112 lb 7 oz)     SpO2: 98% 98% 97% 97%    Intake/Output Summary (Last 24 hours) at 05/14/12 0959 Last data filed at 05/14/12 8119  Gross per 24 hour  Intake    650 ml  Output    100 ml  Net    550 ml   Filed Weights   05/14/12 0335 05/14/12 0620  Weight: 49.4 kg (108 lb 14.5 oz) 51 kg (112 lb 7 oz)    Exam:   General: Elderly female lying in bed in no acute distress  HEENT: Right scalp laceration with staples placed, no active bleeding, no pallor moist oral mucosa  Cardiovascular: S1 and S2 irregular with loud 4/6 systolic murmur  Respiratory: Clear to auscultation bilaterally no added, status post left mastectomy  Abdomen: Soft, nontender, nondistended sounds present  Extremities: Warm, no edema   CNS: AAO x3  Data Reviewed: Basic Metabolic Panel:  Recent Labs Lab 05/13/12 2030 05/14/12 0433  NA 136 137  K 3.9 3.6  CL 102 97  CO2  --  30  GLUCOSE 95 113*  BUN 14 12  CREATININE 1.00 0.65  CALCIUM  --  9.7   Liver Function Tests: No results found for this basename: AST, ALT, ALKPHOS, BILITOT, PROT, ALBUMIN,  in the last 168 hours No results found for this basename: LIPASE, AMYLASE,  in the last  168 hours No results found for this basename: AMMONIA,  in the last 168 hours CBC:  Recent Labs Lab 05/13/12 2010 05/13/12 2030 05/14/12 0433  WBC 6.3  --  8.7  NEUTROABS 4.3  --   --   HGB 15.5* 16.0* 15.8*  HCT 43.6 47.0* 45.5  MCV 92.8  --  92.9  PLT 160  --  169   Cardiac Enzymes:  Recent Labs Lab 05/14/12 0656  TROPONINI <0.30   BNP (last 3 results) No results found for this basename: PROBNP,  in the last 8760 hours CBG: No results found for this basename: GLUCAP,  in the last 168  hours  Recent Results (from the past 240 hour(s))  MRSA PCR SCREENING     Status: None   Collection Time    05/14/12  6:50 AM      Result Value Range Status   MRSA by PCR NEGATIVE  NEGATIVE Final   Comment:            The GeneXpert MRSA Assay (FDA     approved for NASAL specimens     only), is one component of a     comprehensive MRSA colonization     surveillance program. It is not     intended to diagnose MRSA     infection nor to guide or     monitor treatment for     MRSA infections.     Studies: Ct Head Wo Contrast  05/13/2012  *RADIOLOGY REPORT*  Clinical Data:  .  Follow-up.  Lightheadedness.  CT HEAD WITHOUT CONTRAST CT CERVICAL SPINE WITHOUT CONTRAST  Technique:  Multidetector CT imaging of the head and cervical spine was performed following the standard protocol without intravenous contrast.  Multiplanar CT image reconstructions of the cervical spine were also generated.  Comparison:  05/01/2011 head CT.  No comparison cervical spine exam.  CT HEAD  Findings: Left parietal scalp hematoma without underlying fracture or intracranial hemorrhage.  Significant small vessel disease type changes without CT evidence of large acute infarct.  Global atrophy.  Ventricular prominence without change.  No intracranial mass lesion detected on this unenhanced exam.  Vascular calcifications.  Mild exophthalmos.  IMPRESSION: Left parietal scalp hematoma without underlying fracture or intracranial hemorrhage.  CT CERVICAL SPINE  Findings: No cervical spine fracture.  Fusion C2-3.  Cervical spondylotic changes with various degrees of spinal stenosis and foraminal narrowing.  Transverse ligament hypertrophy.  No abnormal prevertebral soft tissue swelling.  1.9 cm mass in the left vallecula/piriform sinus region which appears hyperdense and is displacing adjacent air column extending to the level of the vocal cords.  Etiology indeterminate.  It is possible this represents a benign process such as a  proteinaceous vallecula cyst however, mucosal abnormality not excluded.  Prominent carotid bifurcation calcifications.  No lung apical pneumothorax.  IMPRESSION:  No cervical spine fracture.  Fusion C2-3.  Cervical spondylotic changes with various degrees of spinal stenosis and foraminal narrowing.  Transverse ligament hypertrophy.  1.9 cm mass in the left vallecula/piriform sinus region which appears hyperdense and is displacing adjacent air column extending to the level of the vocal cords.  Etiology indeterminate.  It is possible this represents a benign process such as a proteinaceous vallecula cyst however, mucosal abnormality not excluded.   Original Report Authenticated By: Lacy Duverney, M.D.    Ct Cervical Spine Wo Contrast  05/13/2012  *RADIOLOGY REPORT*  Clinical Data:  .  Follow-up.  Lightheadedness.  CT HEAD WITHOUT CONTRAST CT CERVICAL SPINE  WITHOUT CONTRAST  Technique:  Multidetector CT imaging of the head and cervical spine was performed following the standard protocol without intravenous contrast.  Multiplanar CT image reconstructions of the cervical spine were also generated.  Comparison:  05/01/2011 head CT.  No comparison cervical spine exam.  CT HEAD  Findings: Left parietal scalp hematoma without underlying fracture or intracranial hemorrhage.  Significant small vessel disease type changes without CT evidence of large acute infarct.  Global atrophy.  Ventricular prominence without change.  No intracranial mass lesion detected on this unenhanced exam.  Vascular calcifications.  Mild exophthalmos.  IMPRESSION: Left parietal scalp hematoma without underlying fracture or intracranial hemorrhage.  CT CERVICAL SPINE  Findings: No cervical spine fracture.  Fusion C2-3.  Cervical spondylotic changes with various degrees of spinal stenosis and foraminal narrowing.  Transverse ligament hypertrophy.  No abnormal prevertebral soft tissue swelling.  1.9 cm mass in the left vallecula/piriform sinus region  which appears hyperdense and is displacing adjacent air column extending to the level of the vocal cords.  Etiology indeterminate.  It is possible this represents a benign process such as a proteinaceous vallecula cyst however, mucosal abnormality not excluded.  Prominent carotid bifurcation calcifications.  No lung apical pneumothorax.  IMPRESSION:  No cervical spine fracture.  Fusion C2-3.  Cervical spondylotic changes with various degrees of spinal stenosis and foraminal narrowing.  Transverse ligament hypertrophy.  1.9 cm mass in the left vallecula/piriform sinus region which appears hyperdense and is displacing adjacent air column extending to the level of the vocal cords.  Etiology indeterminate.  It is possible this represents a benign process such as a proteinaceous vallecula cyst however, mucosal abnormality not excluded.   Original Report Authenticated By: Lacy Duverney, M.D.    Dg Chest Port 1 View  05/14/2012  *RADIOLOGY REPORT*  Clinical Data: History of new onset of atrial fibrillation. Previous history given of breast carcinoma.  PORTABLE CHEST - 1 VIEW  Comparison: 04/25/2010.CT 11/22/2010.  Findings: Portable semi erect examination is submitted.  There is slight cardiac silhouette enlargement. Ectasia and nonaneurysmal calcification of the thoracic aorta are seen.  There is slight vascular congestion pattern accentuated by semi erect positioning. There is slight hazy infiltrative density in the left upper midlung area.  There is slight prominence of the reticular interstitial markings.  No alveolar pulmonary edema, consolidation, or pleural effusion is evident.  No pulmonary nodules or masses are seen. There is slightly osteopenic appearance of the bones.  IMPRESSION: Slight cardiac silhouette enlargement.  Slight vascular congestion pattern accentuated by semi-erect positioning.  Hazy infiltrative density in left upper midlung area.  Slight prominence of reticular interstitial markings.  No  alveolar pulmonary edema, consolidation, or pleural effusion is evident.   Original Report Authenticated By: Onalee Hua Call     Scheduled Meds: . sodium chloride   Intravenous STAT  . amiodarone  150 mg Intravenous Once  . donepezil  5 mg Oral QHS  . folic acid  1 mg Oral Daily  . magnesium sulfate 1 - 4 g bolus IVPB  1 g Intravenous Once  . multivitamin with minerals  1 tablet Oral Daily  . potassium chloride  40 mEq Oral Once  . [START ON 05/15/2012] rivaroxaban  20 mg Oral Q breakfast  . sodium chloride  3 mL Intravenous Q12H  . sodium chloride  3 mL Intravenous Q12H  . thiamine  100 mg Oral Daily   Or  . thiamine  100 mg Intravenous Daily   Continuous Infusions: . amiodarone (NEXTERONE  PREMIX) 360 mg/200 mL dextrose     And  . amiodarone (NEXTERONE PREMIX) 360 mg/200 mL dextrose        Time spent: 35 minutes    Heath Tesler  [facility] Pager [phone] If 8PM-8AM, please contact night-coverage at [email or web address], password [facility] 05/14/2012, 9:59 AM  LOS: 1 day

## 2012-05-14 NOTE — Progress Notes (Signed)
Event: Notified by RN that pt arrived to floor hypertensive w/ BP of 173/97. Pt was given Hydralazine 10mg  per PRN order for SBP < 160. At approx 0500 pt dropped her BP to SBP of 76. HR at that time noted to be 121. Rapid Response RN has been called and is currently at bedside. EKG performed and shows A-Fib w/ RVR. Pt is asymptomatic. IVF bolus ordered. NP to bedside. Subjective: Pt denies CP or SOB. Admits to h/o "irregular heartbeat" but vague about specifics of her PMhx.  Objective: Tricia [name] is a 79 y.o. female w/ h/o  idiopathic hypertrophic subaortic stenosis, hypertension, tobacco abuse, breast Ca s/p mastectomy that presented  to the ER after patient had a fall today at her house. Patient states around noontime she was walking towards the bathroom and she states she fell and hit her head. She sustained a small laceration but did not lose consciousness. She was not sure how she fell she states she may have slipped but not sure. Had her scalp lac repaired, Ct's noted hematoma and  A (L) neck mass, but otherwise w/o acute findings.  At bedside pt noted resting in NAD. She is awake, alert and oriented x 3. She admits she is still a smoker. BBS diminished but w/o crackles, rhonchi or wheezes. Frequent congested cough noted. Current VS, BP-84/56 (MAP-65)T97.6, P-98-106,R-20 w/ 02 sast of 98% on R/A. 12-Lead EKG reveals A-Fib w/ RVR w/ marked ST abnormality. Echo in 2010 revealed EF 60-65%. Troponin I  PCXR pending. Pt currently receiving IVF boluses. Assessment/Plan: 1. New onset A-Fib w/ RVR in clinical setting of pt w/ idiopathic hypertrophic subaortic stenosis. Pt is hypotensive but otherwise asymptomatic. Denies CP or SOB. Troponin I and PCXR pending. Pt's rate currently 98-106 and BP improving. Pt has been transferred to SDU for close telemetry monitoring. Discussed pt w/ Dr Toniann Fail who has also reveiewed EKG. He will consult cardiology. Will defer further changes to pt's plan of care to  rounding MD this am.  Leanne Chang, NP-C [facility] Pager [phone]

## 2012-05-14 NOTE — ED Notes (Signed)
Attempted to call report.  Nurse in isolation room. Call back

## 2012-05-15 ENCOUNTER — Encounter (HOSPITAL_COMMUNITY)
Admission: EM | Disposition: A | Discharge status: Skilled Nursing Facility | Payer: Self-pay | Source: Home / Self Care | Attending: Internal Medicine

## 2012-05-15 DIAGNOSIS — I214 Non-ST elevation (NSTEMI) myocardial infarction: Secondary | ICD-10-CM

## 2012-05-15 LAB — BASIC METABOLIC PANEL
CO2: 23 mEq/L (ref 19–32)
Calcium: 9 mg/dL (ref 8.4–10.5)
GFR calc Af Amer: >90 mL/min (ref >90)
GFR calc non Af Amer: 81 mL/min — ABNORMAL LOW (ref >90)
Sodium: 132 mEq/L — ABNORMAL LOW (ref 135–145)

## 2012-05-15 LAB — CBC
MCV: 92.7 fL (ref 78.0–100.0)
Platelets: 130 10*3/uL — ABNORMAL LOW (ref 150–400)
RBC: 4.38 MIL/uL (ref 3.87–5.11)
RDW: 13.9 % (ref 11.5–15.5)
WBC: 6.8 10*3/uL (ref 4.0–10.5)

## 2012-05-15 SURGERY — CARDIOVERSION
Anesthesia: Monitor Anesthesia Care

## 2012-05-15 MED ORDER — METOPROLOL SUCCINATE ER 100 MG PO TB24
100.0000 mg | ORAL_TABLET | Freq: Every day | ORAL | Status: DC
  Administered 2012-05-15 – 2012-05-19 (×5): 100 mg via ORAL
  Filled 2012-05-15 (×5): qty 1

## 2012-05-15 MED ORDER — AMIODARONE HCL 200 MG PO TABS: 200.0000 mg | ORAL_TABLET | Freq: Every day | ORAL | Status: DC

## 2012-05-15 MED ORDER — POTASSIUM CHLORIDE CRYS ER 20 MEQ PO TBCR
40.0000 meq | EXTENDED_RELEASE_TABLET | Freq: Once | ORAL | Status: AC
  Administered 2012-05-15: 40 meq via ORAL
  Filled 2012-05-15: qty 2

## 2012-05-15 MED ORDER — LORAZEPAM 2 MG/ML IJ SOLN
1.0000 mg | INTRAMUSCULAR | Status: DC | PRN
  Administered 2012-05-16 (×4): 1 mg via INTRAVENOUS
  Filled 2012-05-15 (×5): qty 1

## 2012-05-15 MED ORDER — LORAZEPAM 1 MG PO TABS: 1.0000 mg | ORAL_TABLET | ORAL | Status: DC | PRN

## 2012-05-15 MED ORDER — LORAZEPAM 2 MG/ML IJ SOLN
2.0000 mg | Freq: Once | INTRAMUSCULAR | Status: AC
  Administered 2012-05-15: 2 mg via INTRAVENOUS

## 2012-05-15 MED ORDER — AMIODARONE HCL 200 MG PO TABS
200.0000 mg | ORAL_TABLET | Freq: Two times a day (BID) | ORAL | Status: DC
  Administered 2012-05-15 (×2): 200 mg via ORAL
  Filled 2012-05-15 (×4): qty 1

## 2012-05-15 NOTE — Progress Notes (Signed)
Patient ID: Tricia [name], female   DOB: [birth year], 79 y.o.   MRN: [id number]   Plans had been put in place to cardiovert the patient this AM at 9:00 if atrial fib persisted.  I have spoken with the nurse this AM.  Patient converted to NSR yesterday. Therefore, plans for cardioversion have been cancelled.  Anesthesia will be notified of cancellation by nursing team. Patient will be seen on rounds by Dr. Donnie Aho later this AM.  Jerral Bonito, MD

## 2012-05-15 NOTE — Progress Notes (Signed)
Patient transferring to room 1424.  Report called to Kendal Hymen, Charity fundraiser.  Patient to transfer by bed.  Will continue to monitor.

## 2012-05-15 NOTE — Progress Notes (Signed)
[facility] PROGRESS NOTE  Tricia [name]:[id number] DOB: [birth year] DOA: 05/13/2012 PCP: [name], MD  Brief narrative 79 y/o female with heavy smoking, etoh use, dementia hx of hypertrophic CM presented after a mechanical fall at home. Patient given a dose of hydralazine on admission for elevated BP and developed hypotension with rapid A fib and transferred to SDU. Now having positive troponins.   Assessment/Plan:  Hypotension  Patient was given a dose of hydralazine for elevated blood pressure on admission and subsequently dropped her blood pressure as low as 70s systolic. She has a history of hypertrophic cardiomyopathy as per cardiology evaluation this morning she may have had a hypotensive episode due to further LV outflow tract obstruction due to afterload reduction with hydralazine. Blood pressure heart rate currently stable. Recommend avoiding afterload reducing agents   A. fib with RVR  Likely triggered by her hypotensive episode. She has been started on amiodarone bolus followed by a drip. Now converted to NSR. Switched to po amiodarone this am. A 2-D echo shows hyperdynamic LV with EF of 75-80%. Her CHADS- vascular is 4 and started on Xeralto by cardiology.   NSTEMI possibly in the setting of demand ischemia with hypotension and Afib. Denies any chest pain. Stable on tele. Monitor serial CE. Medical management per cardiology. Resume metoprolol as BP elevated. Check lipid panel. Follow serial CE and tele monitoring   Fall  Mechanical. Has a right scalp laceration with staples placed in the ED. No active bleeding at this time. Monitor clinically.   EtOH use  Patient denies heavy alcohol use however did have significant alcohol level on exam. Noted to be agitated off and on. We'll monitor closely for any withdrawal symptoms.  CIWA protocol in place  Dementia  Appears mild. Continue with Namenda   Tobacco use  Counseled on smoking cessation .smokes 1-2 PPD as  per patient's son. Will order nicotine aptch  Code Status: Full code  Family Communication: son at bedside  Disposition Plan: Home once stable. transfer tele Consultants:  [facility] cardiology) Procedures:  2-D echo today Antibiotics:  None     HPI/Subjective: Patient noted for elevated troponins  Objective: Filed Vitals:   05/15/12 1000 05/15/12 1100 05/15/12 1200 05/15/12 1400  BP: 191/102 114/78 162/94 179/101  Pulse: 72 64 66 68  Temp:   97.8 F (36.6 C)   TempSrc:   Oral   Resp: 22 19 23 20   Height:      Weight:      SpO2: 96% 94% 96% 96%    Intake/Output Summary (Last 24 hours) at 05/15/12 1512 Last data filed at 05/15/12 1400  Gross per 24 hour  Intake  597.1 ml  Output   2690 ml  Net -2092.9 ml   Filed Weights   05/14/12 0335 05/14/12 0620 05/15/12 0600  Weight: 49.4 kg (108 lb 14.5 oz) 51 kg (112 lb 7 oz) 52.164 kg (115 lb)    Exam:  General: Elderly female lying in bed in no acute distress . sleepy HEENT: Right scalp laceration with staples placed, no active bleeding, no pallor moist oral mucosa  Cardiovascular: S1 and S2 regular with loud 4/6 systolic murmur  Respiratory: Clear to auscultation bilaterally no added, status post left mastectomy  Abdomen: Soft, nontender, nondistended sounds present  Extremities: Warm, no edema , poorly kept toenails CNS: AAO x 2, sleepy, no tremors   Data Reviewed: Basic Metabolic Panel:  Recent Labs Lab 05/13/12 2030 05/14/12 0433 05/15/12 0400  NA 136 137 132*  K 3.9 3.6 3.4*  CL 102 97 99  CO2  --  30 23  GLUCOSE 95 113* 104*  BUN 14 12 11   CREATININE 1.00 0.65 0.60  CALCIUM  --  9.7 9.0   Liver Function Tests: No results found for this basename: AST, ALT, ALKPHOS, BILITOT, PROT, ALBUMIN,  in the last 168 hours No results found for this basename: LIPASE, AMYLASE,  in the last 168 hours No results found for this basename: AMMONIA,  in the last 168 hours CBC:  Recent Labs Lab 05/13/12 2010  05/13/12 2030 05/14/12 0433 05/15/12 0400  WBC 6.3  --  8.7 6.8  NEUTROABS 4.3  --   --   --   HGB 15.5* 16.0* 15.8* 14.2  HCT 43.6 47.0* 45.5 40.6  MCV 92.8  --  92.9 92.7  PLT 160  --  169 130*   Cardiac Enzymes:  Recent Labs Lab 05/14/12 0656 05/14/12 1250 05/14/12 1912  TROPONINI <0.30 2.55* 4.12*   BNP (last 3 results) No results found for this basename: PROBNP,  in the last 8760 hours CBG: No results found for this basename: GLUCAP,  in the last 168 hours  Recent Results (from the past 240 hour(s))  MRSA PCR SCREENING     Status: None   Collection Time    05/14/12  6:50 AM      Result Value Range Status   MRSA by PCR NEGATIVE  NEGATIVE Final   Comment:            The GeneXpert MRSA Assay (FDA     approved for NASAL specimens     only), is one component of a     comprehensive MRSA colonization     surveillance program. It is not     intended to diagnose MRSA     infection nor to guide or     monitor treatment for     MRSA infections.     Studies: Ct Head Wo Contrast  05/13/2012  *RADIOLOGY REPORT*  Clinical Data:  .  Follow-up.  Lightheadedness.  CT HEAD WITHOUT CONTRAST CT CERVICAL SPINE WITHOUT CONTRAST  Technique:  Multidetector CT imaging of the head and cervical spine was performed following the standard protocol without intravenous contrast.  Multiplanar CT image reconstructions of the cervical spine were also generated.  Comparison:  05/01/2011 head CT.  No comparison cervical spine exam.  CT HEAD  Findings: Left parietal scalp hematoma without underlying fracture or intracranial hemorrhage.  Significant small vessel disease type changes without CT evidence of large acute infarct.  Global atrophy.  Ventricular prominence without change.  No intracranial mass lesion detected on this unenhanced exam.  Vascular calcifications.  Mild exophthalmos.  IMPRESSION: Left parietal scalp hematoma without underlying fracture or intracranial hemorrhage.  CT CERVICAL SPINE   Findings: No cervical spine fracture.  Fusion C2-3.  Cervical spondylotic changes with various degrees of spinal stenosis and foraminal narrowing.  Transverse ligament hypertrophy.  No abnormal prevertebral soft tissue swelling.  1.9 cm mass in the left vallecula/piriform sinus region which appears hyperdense and is displacing adjacent air column extending to the level of the vocal cords.  Etiology indeterminate.  It is possible this represents a benign process such as a proteinaceous vallecula cyst however, mucosal abnormality not excluded.  Prominent carotid bifurcation calcifications.  No lung apical pneumothorax.  IMPRESSION:  No cervical spine fracture.  Fusion C2-3.  Cervical spondylotic changes with various degrees of spinal stenosis and foraminal narrowing.  Transverse ligament hypertrophy.  1.9 cm mass in the left vallecula/piriform sinus region which appears hyperdense and is displacing adjacent air column extending to the level of the vocal cords.  Etiology indeterminate.  It is possible this represents a benign process such as a proteinaceous vallecula cyst however, mucosal abnormality not excluded.   Original Report Authenticated By: Lacy Duverney, M.D.    Ct Cervical Spine Wo Contrast  05/13/2012  *RADIOLOGY REPORT*  Clinical Data:  .  Follow-up.  Lightheadedness.  CT HEAD WITHOUT CONTRAST CT CERVICAL SPINE WITHOUT CONTRAST  Technique:  Multidetector CT imaging of the head and cervical spine was performed following the standard protocol without intravenous contrast.  Multiplanar CT image reconstructions of the cervical spine were also generated.  Comparison:  05/01/2011 head CT.  No comparison cervical spine exam.  CT HEAD  Findings: Left parietal scalp hematoma without underlying fracture or intracranial hemorrhage.  Significant small vessel disease type changes without CT evidence of large acute infarct.  Global atrophy.  Ventricular prominence without change.  No intracranial mass lesion detected  on this unenhanced exam.  Vascular calcifications.  Mild exophthalmos.  IMPRESSION: Left parietal scalp hematoma without underlying fracture or intracranial hemorrhage.  CT CERVICAL SPINE  Findings: No cervical spine fracture.  Fusion C2-3.  Cervical spondylotic changes with various degrees of spinal stenosis and foraminal narrowing.  Transverse ligament hypertrophy.  No abnormal prevertebral soft tissue swelling.  1.9 cm mass in the left vallecula/piriform sinus region which appears hyperdense and is displacing adjacent air column extending to the level of the vocal cords.  Etiology indeterminate.  It is possible this represents a benign process such as a proteinaceous vallecula cyst however, mucosal abnormality not excluded.  Prominent carotid bifurcation calcifications.  No lung apical pneumothorax.  IMPRESSION:  No cervical spine fracture.  Fusion C2-3.  Cervical spondylotic changes with various degrees of spinal stenosis and foraminal narrowing.  Transverse ligament hypertrophy.  1.9 cm mass in the left vallecula/piriform sinus region which appears hyperdense and is displacing adjacent air column extending to the level of the vocal cords.  Etiology indeterminate.  It is possible this represents a benign process such as a proteinaceous vallecula cyst however, mucosal abnormality not excluded.   Original Report Authenticated By: Lacy Duverney, M.D.    Dg Chest Port 1 View  05/14/2012  *RADIOLOGY REPORT*  Clinical Data: History of new onset of atrial fibrillation. Previous history given of breast carcinoma.  PORTABLE CHEST - 1 VIEW  Comparison: 04/25/2010.CT 11/22/2010.  Findings: Portable semi erect examination is submitted.  There is slight cardiac silhouette enlargement. Ectasia and nonaneurysmal calcification of the thoracic aorta are seen.  There is slight vascular congestion pattern accentuated by semi erect positioning. There is slight hazy infiltrative density in the left upper midlung area.  There is  slight prominence of the reticular interstitial markings.  No alveolar pulmonary edema, consolidation, or pleural effusion is evident.  No pulmonary nodules or masses are seen. There is slightly osteopenic appearance of the bones.  IMPRESSION: Slight cardiac silhouette enlargement.  Slight vascular congestion pattern accentuated by semi-erect positioning.  Hazy infiltrative density in left upper midlung area.  Slight prominence of reticular interstitial markings.  No alveolar pulmonary edema, consolidation, or pleural effusion is evident.   Original Report Authenticated By: Onalee Hua Call     Scheduled Meds: . amiodarone  200 mg Oral BID  . donepezil  5 mg Oral QHS  . folic acid  1 mg Oral Daily  . multivitamin with minerals  1 tablet Oral Daily  .  rivaroxaban  20 mg Oral Q breakfast  . sodium chloride  3 mL Intravenous Q12H  . sodium chloride  3 mL Intravenous Q12H  . thiamine  100 mg Oral Daily   Or  . thiamine  100 mg Intravenous Daily   Continuous Infusions:     Time spent: 35 minutes    Jef Futch  [facility] Pager [phone] If 8PM-8AM, please contact night-coverage at [email or web address], password [facility] 05/15/2012, 3:12 PM  LOS: 2 days

## 2012-05-15 NOTE — Progress Notes (Signed)
Subjective:  Very sleepy and able to arouse but goes back to sleep.  When awake, she says no chest pain or SOB.  Objective:  Vital Signs in the last 24 hours: BP 184/99  Pulse 65  Temp(Src) 97.4 F (36.3 C) (Oral)  Resp 21  Ht 5' 5.5" (1.664 m)  Wt 52.164 kg (115 lb)  BMI 18.84 kg/m2  SpO2 96%  Physical Exam: Sleepy WF in NAD  Episodes of apnea witnessed Lungs:  Clear Cardiac:  Regular rhythm, normal S1 and S2, no S3, harsh 2/6 systolic murmur Abdomen:  Soft, nontender, no masses Extremities:  No edema present  Intake/Output from previous day: 02/14 0701 - 02/15 0700 In: 860.4 [P.O.:240; I.V.:400.4; IV Piggyback:100] Out: 2165 [Urine:2165]  Weight Filed Weights   05/14/12 0335 05/14/12 0620 05/15/12 0600  Weight: 49.4 kg (108 lb 14.5 oz) 51 kg (112 lb 7 oz) 52.164 kg (115 lb)    Lab Results: Basic Metabolic Panel:  Recent Labs  56/21/30 0433 05/15/12 0400  NA 137 132*  K 3.6 3.4*  CL 97 99  CO2 30 23  GLUCOSE 113* 104*  BUN 12 11  CREATININE 0.65 0.60   CBC:  Recent Labs  05/13/12 2010  05/14/12 0433 05/15/12 0400  WBC 6.3  --  8.7 6.8  NEUTROABS 4.3  --   --   --   HGB 15.5*  < > 15.8* 14.2  HCT 43.6  < > 45.5 40.6  MCV 92.8  --  92.9 92.7  PLT 160  --  169 130*  < > = values in this interval not displayed. Cardiac Enzymes:  Recent Labs  05/14/12 0656 05/14/12 1250 05/14/12 1912  TROPONINI <0.30 2.55* 4.12*    Telemetry: Sinus with what looks like marked first degree AV block  ECHO No regional wall motion abnormalities  Assessment/Plan:  1.  Atrial Fibrillation now in sinus with what looks like marked first degree block on monitor 2. Hypertensive heart disease 3. Non STEMI may be due to demand ischemia in settiing of a fib 4. Alcohol use may have contributed to her fall and a fib  Rec:  Check EKG.  OK to go to floor and follow serial troponins.  Watch over weekend.  Darden Palmer  MD [facility]  05/15/2012, 7:56  AM

## 2012-05-15 NOTE — Progress Notes (Signed)
Recd pt from ICU, agree w/AM assessment.  Pt is confused and becoming more so since her son left despite ativan.  Attempted to stand her but is too weak to stand, bed alarm on w/frequent reorienting.

## 2012-05-15 NOTE — Progress Notes (Signed)
CRITICAL VALUE ALERT  Critical value received:  T- 4.12  Date of notification:  05/14/12  Time of notification:  2011  Critical value read back: yes  Nurse who received alert:  Conley Rolls, RN  MD notified (1st page):  [facility]  Time of first page: 2047  MD notified (2nd page):  Time of second page: na  Responding MD:  [facility]  Time MD responded: 2049

## 2012-05-15 NOTE — ED Provider Notes (Signed)
Medical screening examination/treatment/procedure(s) were conducted as a shared visit with non-physician practitioner(s) and myself.  I personally evaluated the patient during the encounter  Please see my separate respective documentation pertaining to this patient encounter   Vida Roller, MD 05/15/12 1043

## 2012-05-16 DIAGNOSIS — R410 Disorientation, unspecified: Secondary | ICD-10-CM | POA: Diagnosis not present

## 2012-05-16 DIAGNOSIS — R404 Transient alteration of awareness: Secondary | ICD-10-CM

## 2012-05-16 LAB — LIPID PANEL
HDL: 86 mg/dL (ref >39)
Total CHOL/HDL Ratio: 2.2 RATIO

## 2012-05-16 LAB — TROPONIN I: Troponin I: 1.49 ng/mL (ref <0.30)

## 2012-05-16 MED ORDER — VITAMINS A & D EX OINT
TOPICAL_OINTMENT | CUTANEOUS | Status: AC
  Administered 2012-05-16: 15:00:00
  Filled 2012-05-16: qty 5

## 2012-05-16 MED ORDER — AMIODARONE HCL 200 MG PO TABS
200.0000 mg | ORAL_TABLET | Freq: Three times a day (TID) | ORAL | Status: DC
  Administered 2012-05-16 – 2012-05-19 (×10): 200 mg via ORAL
  Filled 2012-05-16 (×14): qty 1

## 2012-05-16 MED ORDER — NICOTINE 21 MG/24HR TD PT24
21.0000 mg | MEDICATED_PATCH | Freq: Every day | TRANSDERMAL | Status: DC
  Administered 2012-05-16 – 2012-05-19 (×4): 21 mg via TRANSDERMAL
  Filled 2012-05-16 (×4): qty 1

## 2012-05-16 NOTE — Progress Notes (Signed)
Subjective:  Confused and not oriented.  In mitts and is trying to remove them. No SOB or chest pain.  Objective:  Vital Signs in the last 24 hours: BP 98/62  Pulse 73  Temp(Src) 98 F (36.7 C) (Oral)  Resp 22  Ht 5' 5.5" (1.664 m)  Wt 52.164 kg (115 lb)  BMI 18.84 kg/m2  SpO2 92%  Physical Exam: Confused WF in NAD  Lungs:  Clear Cardiac:  Regular rhythm, normal S1 and S2, no S3, harsh 2/6 systolic murmur Extremities:  No edema present  Intake/Output from previous day: 02/15 0701 - 02/16 0700 In: 493 [P.O.:420; I.V.:3] Out: 3675 [Urine:3675]  Weight Filed Weights   05/14/12 0335 05/14/12 0620 05/15/12 0600  Weight: 49.4 kg (108 lb 14.5 oz) 51 kg (112 lb 7 oz) 52.164 kg (115 lb)    Lab Results: Basic Metabolic Panel:  Recent Labs  47/82/95 0433 05/15/12 0400  NA 137 132*  K 3.6 3.4*  CL 97 99  CO2 30 23  GLUCOSE 113* 104*  BUN 12 11  CREATININE 0.65 0.60   CBC:  Recent Labs  05/13/12 2010  05/14/12 0433 05/15/12 0400  WBC 6.3  --  8.7 6.8  NEUTROABS 4.3  --   --   --   HGB 15.5*  < > 15.8* 14.2  HCT 43.6  < > 45.5 40.6  MCV 92.8  --  92.9 92.7  PLT 160  --  169 130*   Cardiac Enzymes:  Recent Labs  05/14/12 1912 05/15/12 2320 05/16/12 0515  TROPONINI 4.12* 1.49* 0.85*    Telemetry: Atrial fib with some post termination paouses  ECHO No regional wall motion abnormalities  Assessment/Plan:  1.  Atrial Fibrillation  Recurrent, but rate controlled with some post termination pauses and occasional bradycardia. 2. Hypertensive heart disease 3. Non STEMI may be due to demand ischemia in settiing of a fib,  4. Alcohol use may have contributed to her fall and a fib  Rec:  She has had some recurrent atrial fibrillation.Troponin is coming down and prob was related to rapid a fib.  With her confusion and Xarelto, will watch for now. Have held beta blocker due to bradycardia.  Will try to increase amiodarone a little to see if we can maintain NSR  in setting of HOCM.  Darden Palmer  MD [facility]  05/16/2012, 8:29 AM

## 2012-05-16 NOTE — Plan of Care (Signed)
Problem: Consults Goal: General Medical Patient Education See Patient Education Module for specific education.  Outcome: Completed/Met Date Met:  05/16/12 Son's informed & info given

## 2012-05-16 NOTE — Progress Notes (Signed)
Pharm sent meds up from where ICU had sent them down w/no count sheet.  Sons at bedside, I gave the meds to the son she lives with to take home

## 2012-05-16 NOTE — Progress Notes (Signed)
05/15/12- At change of shift report 19:10-the pt was very anxious & agitated. Son was with pt, & he could not keep her in the bed, & get her to comply with dr orders. NP Elray Mcgregor notified, & orders received to give now dose of IV Ativan. It took almost 2.5 hrs before the pt went to sleep. Her CIWA at beginning of my shift was 10.At 01:00 am on 05/16/12 the pt was awake again, trying to climb out of bed, again very agitated, & non-compliant. CIWA at this time is 12. Ativan 1mg  IVP given at 01:15am. Will cont to monitor. Notified M. Lynch of a 2.70 pause at 01:50, no new orders at this time, the pt is asymptomatic, & asleep, snoring. Robinette Haines notified at 03;05am of pt's conversion back to A-Fib with rate in 60's, the pt con't to sleep. Will con't to monitor for changes.05:00am-05/16/12- CIWA is 9 at present, but still somewhat sedated from last prn, so am going to monitor pt & not give her any more ativan at this time. She coughed out some thick yellow sputum. Keeping HOB elevated, a observing for aspiration precautions, due to swallowing difficulty. The pt needs a swallow eval done by ST, son said last evening it had been years since she had one done. We are presently having to crush meds & put in applesauce, for her to be able to swallow them. Con't to monitor.

## 2012-05-16 NOTE — Progress Notes (Signed)
Pts  Son left for a while, pt constantly throwing legs over siderails, setting off bed alarm, non compliant, confused, unable to reorient. See CIWA, ativan given

## 2012-05-16 NOTE — Progress Notes (Signed)
[facility] PROGRESS NOTE  Tricia [name] NFA:[id number] DOB: 23-Mar-1926 DOA: 05/13/2012 PCP: [name], MD  Brief narrative  79 y/o female with heavy smoking, etoh use, dementia hx of hypertrophic CM presented after a mechanical fall at home. Patient given a dose of hydralazine on admission for elevated BP and developed hypotension with rapid A fib and transferred to SDU. Now having positive troponins.    Assessment/Plan:  Hypotension  Patient was given a dose of hydralazine for elevated blood pressure on admission and subsequently dropped her blood pressure as low as 70s systolic. She has a history of hypertrophic cardiomyopathy as per cardiology evaluation this morning she may have had a hypotensive episode due to further LV outflow tract obstruction due to afterload reduction with hydralazine. Blood pressure  currently stable. Recommend avoiding afterload reducing agents.   A. fib with RVR  Likely triggered by her hypotensive episode. She has been started on amiodarone bolus followed by a drip. Now converted to NSR. Switched to po amiodarone  A 2-D echo shows hyperdynamic LV with EF of 75-80%. Her CHADS- vascular is 4 and started on Xeralto by cardiology.  Patient bradycardic to 40s this morning and beta blocker stopped by cardiology. And the dose has been increased and we'll monitor.  NSTEMI  possibly in the setting of demand ischemia with hypotension and Afib. Denies any chest pain. Stable on tele. Monitor serial CE. Medical management per cardiology. Metoprolol held as heart rate low. Check lipid panel.  Troponin slowly trending down.  Fall  Mechanical. Has a right scalp laceration with staples placed in the ED. No active bleeding at this time. Monitor clinically.   Dementia with delirium Patient lives with her son and as per him she does have moderate underlying dementia. However she has been very delirious since yesterday and periodically agitated and combative. Her  CIWA scale he is quite elevated and requiring Ativan with good effect. Patient also drinks on a daily basis (as per son has one drink per day however her alcohol level on presentation was quite elevated. She also smokes 2-3 packs of cigarettes per day. Currently patient is calm in stable. Continue Ativan when necessary as per CIWA. Order for nicotine patch Continue thiamine and multivitamin  ? Dysphagia Patient noticed to have difficulty swallowing as per nurse. Speech and swallow evaluation ordered  Dementia  Moderate as per son.. Continue with Namenda   Tobacco use  Counseled on smoking cessation .smokes almost over 2 packs per day. Nicotine patch ordered  Code Status: Full code  Family Communication: son at bedside  Disposition Plan: PT evaluation pending Likely home once stable  Consultants:  [facility]) Procedures:  2-D echo  Antibiotics:  None    HPI/Subjective: Patient transfer out of step down unit yesterday however was very agitated and combative in the floor. Noted for elevated see if the and given Ativan with some improvement. Patient bradycardic this morning on telemetry. Son at bedside. Patient denies any symptoms at this time.  Objective: Filed Vitals:   05/15/12 2101 05/15/12 2300 05/16/12 0625 05/16/12 1100  BP:  128/83 98/62 118/75  Pulse: 69 82 73 69  Temp:   98 F (36.7 C)   TempSrc: Oral  Oral   Resp: 20  22   Height:      Weight:      SpO2: 93%  92%     Intake/Output Summary (Last 24 hours) at 05/16/12 1229 Last data filed at 05/16/12 0558  Gross per 24 hour  Intake    383 ml  Output   3050 ml  Net  -2667 ml   Filed Weights   05/14/12 0335 05/14/12 0620 05/15/12 0600  Weight: 49.4 kg (108 lb 14.5 oz) 51 kg (112 lb 7 oz) 52.164 kg (115 lb)    Exam:  General: Elderly female lying in bed in no acute distress .   HEENT: Right scalp laceration with staples placed, no active bleeding, no pallor moist oral mucosa Cardiovascular: S1 and  S2 regular with loud 4/6 systolic murmur  Respiratory: Clear to auscultation bilaterally no added, status post left mastectomy  Abdomen: Soft, nontender, nondistended sounds present  Extremities: Warm, no edema , poorly kept toenails  CNS: AAO x 2, no tremors   Data Reviewed: Basic Metabolic Panel:  Recent Labs Lab 05/13/12 2030 05/14/12 0433 05/15/12 0400  NA 136 137 132*  K 3.9 3.6 3.4*  CL 102 97 99  CO2  --  30 23  GLUCOSE 95 113* 104*  BUN 14 12 11   CREATININE 1.00 0.65 0.60  CALCIUM  --  9.7 9.0   Liver Function Tests: No results found for this basename: AST, ALT, ALKPHOS, BILITOT, PROT, ALBUMIN,  in the last 168 hours No results found for this basename: LIPASE, AMYLASE,  in the last 168 hours No results found for this basename: AMMONIA,  in the last 168 hours CBC:  Recent Labs Lab 05/13/12 2010 05/13/12 2030 05/14/12 0433 05/15/12 0400  WBC 6.3  --  8.7 6.8  NEUTROABS 4.3  --   --   --   HGB 15.5* 16.0* 15.8* 14.2  HCT 43.6 47.0* 45.5 40.6  MCV 92.8  --  92.9 92.7  PLT 160  --  169 130*   Cardiac Enzymes:  Recent Labs Lab 05/14/12 1250 05/14/12 1912 05/15/12 2320 05/16/12 0515 05/16/12 1110  TROPONINI 2.55* 4.12* 1.49* 0.85* 0.81*   BNP (last 3 results) No results found for this basename: PROBNP,  in the last 8760 hours CBG: No results found for this basename: GLUCAP,  in the last 168 hours  Recent Results (from the past 240 hour(s))  MRSA PCR SCREENING     Status: None   Collection Time    05/14/12  6:50 AM      Result Value Range Status   MRSA by PCR NEGATIVE  NEGATIVE Final   Comment:            The GeneXpert MRSA Assay (FDA     approved for NASAL specimens     only), is one component of a     comprehensive MRSA colonization     surveillance program. It is not     intended to diagnose MRSA     infection nor to guide or     monitor treatment for     MRSA infections.     Studies: No results found.  Scheduled Meds: . amiodarone   200 mg Oral TID  . donepezil  5 mg Oral QHS  . folic acid  1 mg Oral Daily  . metoprolol succinate  100 mg Oral Daily  . multivitamin with minerals  1 tablet Oral Daily  . nicotine  21 mg Transdermal Daily  . rivaroxaban  20 mg Oral Q breakfast  . sodium chloride  3 mL Intravenous Q12H  . sodium chloride  3 mL Intravenous Q12H  . thiamine  100 mg Oral Daily   Or  . thiamine  100 mg Intravenous Daily   Continuous Infusions:  Time spent: 25 minutes    Eddie North  [facility] Pager [phone] If 8PM-8AM, please contact night-coverage at [email or web address], password [facility] 05/16/2012, 12:29 PM  LOS: 3 days

## 2012-05-17 DIAGNOSIS — E876 Hypokalemia: Secondary | ICD-10-CM | POA: Diagnosis not present

## 2012-05-17 LAB — BASIC METABOLIC PANEL
BUN: 23 mg/dL (ref 6–23)
CO2: 25 mEq/L (ref 19–32)
Chloride: 93 mEq/L — ABNORMAL LOW (ref 96–112)
GFR calc non Af Amer: 45 mL/min — ABNORMAL LOW (ref >90)
Glucose, Bld: 145 mg/dL — ABNORMAL HIGH (ref 70–99)
Potassium: 2.9 mEq/L — ABNORMAL LOW (ref 3.5–5.1)

## 2012-05-17 MED ORDER — POTASSIUM CHLORIDE CRYS ER 20 MEQ PO TBCR
40.0000 meq | EXTENDED_RELEASE_TABLET | ORAL | Status: AC
  Administered 2012-05-17 (×2): 40 meq via ORAL
  Filled 2012-05-17 (×2): qty 2

## 2012-05-17 MED ORDER — MAGNESIUM SULFATE IN D5W 10-5 MG/ML-% IV SOLN
1.0000 g | Freq: Once | INTRAVENOUS | Status: AC
  Administered 2012-05-17: 1 g via INTRAVENOUS
  Filled 2012-05-17: qty 100

## 2012-05-17 NOTE — Progress Notes (Addendum)
[facility] PROGRESS NOTE  Tricia [name] ZOX:[id number] DOB: [birth year] DOA: 05/13/2012 PCP: [name], MD  Brief narrative  79 y/o female with heavy smoking, etoh use, dementia hx of hypertrophic CM presented after a mechanical fall at home. Patient given a dose of hydralazine on admission for elevated BP and developed hypotension with rapid A fib and transferred to SDU. Now having positive troponins.   Assessment/Plan:  Hypotension  Patient was given a dose of hydralazine for elevated blood pressure on admission and subsequently dropped her blood pressure as low as 70s systolic. She has a history of hypertrophic cardiomyopathy as per cardiology evaluation this morning she may have had a hypotensive episode due to further LV outflow tract obstruction due to afterload reduction with hydralazine. Blood pressure currently stable. Recommend avoiding afterload reducing agents.   A. fib with RVR  Likely triggered by her hypotensive episode. She has been started on amiodarone bolus followed by a drip. Now converted to NSR. Switched to po amiodarone A 2-D echo shows hyperdynamic LV with EF of 75-80%. Her CHADS- vascular is 4 and started on Xeralto by cardiology.  Patient bradycardic and given episodes of hypotension, BB held  NSTEMI  possibly in the setting of demand ischemia with hypotension and Afib. Denies any chest pain. Stable on tele. Monitor serial CE. Medical management per cardiology. Metoprolol held as heart rate low. lipid panel wnl Troponin slowly trending down. No further we/up per cardiology  Fall  Mechanical. Has a right scalp laceration with staples placed in the ED. No active bleeding at this time. Monitor clinically.   Dementia with delirium  Patient lives with her son and as per him she does have moderate underlying dementia. However she has been very delirious since day 2 and periodically agitated and combative. Her CIWA scale  quite elevated and requiring Ativan  with good effect. Patient also drinks on a daily basis (as per son has one drink per day however her alcohol level on presentation was quite elevated. She also smokes 2-3 packs of cigarettes per day.  Currently patient sleepy responding well to PRN ativan. Continue Ativan when necessary as per CIWA. Continue  nicotine patch  Continue thiamine and multivitamin  Monitor for withdrawal symptoms and DTs  Hypokalemia  replenishing with kcl. Monitor in am   ? Dysphagia  Patient noticed to have difficulty swallowing as per nurse. Speech and swallow evaluation  Appreciated. Recommend dys level 3 diet and MBS. Will order once patient more alert and awake. Also given finding of 1.9 cm left vallecula / piriform sinus mass on cervical CT, ENT evaluation will be helpful as this may be cause of her dysphagia. Will consult   Dementia  Moderate as per son.. Continue with Namenda   Tobacco use  Counseled on smoking cessation .smokes almost over 2 packs per day. Nicotine patch ordered   Code Status: Full code  Family Communication: son at bedside  Disposition Plan: PT evaluation pending . Son informs being unable to take care of her at home.  Likely home once stable  Consultants:  [facility]) Procedures:  2-D echo  Antibiotics:  None      HPI/Subjective: Has been very sleepy with ativan overnight. Noted for short pauses of tele  Objective: Filed Vitals:   05/16/12 2200 05/17/12 0510 05/17/12 0900 05/17/12 1312  BP: 141/80 94/59  130/68  Pulse: 78 62 70 60  Temp: 98.3 F (36.8 C) 97.8 F (36.6 C)  97.7 F (36.5 C)  TempSrc: Oral Oral  Oral  Resp: 18 20  20   Height:      Weight:      SpO2: 96% 96%  94%    Intake/Output Summary (Last 24 hours) at 05/17/12 1450 Last data filed at 05/17/12 1313  Gross per 24 hour  Intake    240 ml  Output    750 ml  Net   -510 ml   Filed Weights   05/14/12 0335 05/14/12 0620 05/15/12 0600  Weight: 49.4 kg (108 lb 14.5 oz) 51 kg (112 lb 7  oz) 52.164 kg (115 lb)    Exam:  General: Elderly female lying in bed in no acute distress .  HEENT: Right scalp laceration with staples placed, no active bleeding, no pallor moist oral mucosa  Cardiovascular: S1 and S2 regular with loud 4/6 systolic murmur  Respiratory: Clear to auscultation bilaterally no added, status post left mastectomy  Abdomen: Soft, nontender, nondistended sounds present  Extremities: Warm, no edema , poorly kept toenails  CNS: AAO x1-2 , appears sleepy , no tremors      Data Reviewed: Basic Metabolic Panel:  Recent Labs Lab 05/13/12 2030 05/14/12 0433 05/15/12 0400 05/17/12 0433  NA 136 137 132* 130*  K 3.9 3.6 3.4* 2.9*  CL 102 97 99 93*  CO2  --  30 23 25   GLUCOSE 95 113* 104* 145*  BUN 14 12 11 23   CREATININE 1.00 0.65 0.60 1.10  CALCIUM  --  9.7 9.0 9.3  MG  --   --   --  1.8   Liver Function Tests: No results found for this basename: AST, ALT, ALKPHOS, BILITOT, PROT, ALBUMIN,  in the last 168 hours No results found for this basename: LIPASE, AMYLASE,  in the last 168 hours No results found for this basename: AMMONIA,  in the last 168 hours CBC:  Recent Labs Lab 05/13/12 2010 05/13/12 2030 05/14/12 0433 05/15/12 0400  WBC 6.3  --  8.7 6.8  NEUTROABS 4.3  --   --   --   HGB 15.5* 16.0* 15.8* 14.2  HCT 43.6 47.0* 45.5 40.6  MCV 92.8  --  92.9 92.7  PLT 160  --  169 130*   Cardiac Enzymes:  Recent Labs Lab 05/14/12 1250 05/14/12 1912 05/15/12 2320 05/16/12 0515 05/16/12 1110  TROPONINI 2.55* 4.12* 1.49* 0.85* 0.81*   BNP (last 3 results) No results found for this basename: PROBNP,  in the last 8760 hours CBG: No results found for this basename: GLUCAP,  in the last 168 hours  Recent Results (from the past 240 hour(s))  MRSA PCR SCREENING     Status: None   Collection Time    05/14/12  6:50 AM      Result Value Range Status   MRSA by PCR NEGATIVE  NEGATIVE Final   Comment:            The GeneXpert MRSA Assay (FDA      approved for NASAL specimens     only), is one component of a     comprehensive MRSA colonization     surveillance program. It is not     intended to diagnose MRSA     infection nor to guide or     monitor treatment for     MRSA infections.     Studies: No results found.  Scheduled Meds: . amiodarone  200 mg Oral TID  . donepezil  5 mg Oral QHS  . folic acid  1 mg Oral Daily  .  metoprolol succinate  100 mg Oral Daily  . multivitamin with minerals  1 tablet Oral Daily  . nicotine  21 mg Transdermal Daily  . rivaroxaban  20 mg Oral Q breakfast  . sodium chloride  3 mL Intravenous Q12H  . sodium chloride  3 mL Intravenous Q12H  . thiamine  100 mg Oral Daily   Or  . thiamine  100 mg Intravenous Daily   Continuous Infusions:     Time spent: 25 minutes    Tricia [name]  [facility] Pager [phone]. If 8PM-8AM, please contact night-coverage at [email or web address], password [email or web address] 05/17/2012, 2:50 PM  LOS: 4 days

## 2012-05-17 NOTE — Progress Notes (Signed)
05/16/12- It was reported to me by CMT that the pt converted to NSR around 15:29. 05/17/12-CMT reported a skipped beat at 00:51am. The pt is resting off of Ativan prn given for CIWA of 9. The pt was asymptomatic, & previously had pauses yesterday of 2.70 & 2.19 & dr was made aware of in rounds & meds changed. Will con't to monitor, & report findings.

## 2012-05-17 NOTE — Evaluation (Addendum)
Clinical/Bedside Swallow Evaluation Patient Details  Name: Tricia [name] MRN: [id number] Date of Birth: 03/15/1926  Today's Date: 05/17/2012 Time: 9562-1308 SLP Time Calculation (min): 57 min  Past Medical History:  Past Medical History  Diagnosis Date  . Idiopathic hypertrophic subaortic stenosis   . Irregular heart beat   . Fibrocystic breast disease   . Tobacco abuse   . Hypertension   . History of colonoscopy   . Blood transfusion 10 yrs ago    after bleeding ulcer in stomach  . Heart murmur   . Cancer     left breast   Past Surgical History:  Past Surgical History  Procedure Laterality Date  . US echocardiography  12/05/2008    EF 60-65%  . US echocardiography  12/01/2007    EF 60-65%  . US echocardiography  07/01/2006    EF 55-60%  . US echocardiography  06/26/2005    EF 55-60%  . Cardiac catheterization  01/30/2002    EF 75-80%  . Appendectomy  1946  . Right breast benign lump removed  yrs ago  . Breast surgery  10/21/11    simple mastectomy   HPI:  79 yo female adm to [facility] on 05-13-12 after being found down in bathroom by son Alfredo Bach.  Pt PMH + for dementia, smoker - heavy, breast cancer s/p left masectomy and medication per son - no radiation.  Pt head CT 05-13-12 showed atrophy, parietal area skull laceration, C2-C3 fusion, and 1.9 cm mass in left valleculae/pirform sinus to level of vocal folds.  RN noted pt to have dysphagia and swallow evaluation was ordered.     Assessment / Plan / Recommendation Clinical Impression  Pt presents with clinical indicators of oropharyngeal dysphagia characterized by delayed swallow, multiple swallows with each bolus, and cough x2 during po intake *delayed.  Please note pt does have a baseline cough.  Pt is dysphonic - reports hoarsness exacerbated in the last months.  Pt and son report pt is a "slow eater" and she has problems swallowing pills.  Note results of cervical spine CT 05-14-11 indicating pharyngeal mass and would recommend MD  consider ENT consult given pt h/o smoking.  Suspect possible pharyngeal mass may be impacting pt's swallowing ability.    Rec MBS to instrumentally evaluate swallow ability and provide strategies to mitigate dysphagia- also ? esop component - although pt denies symptoms.    SLP modified diet in the interim time.  Educated son Alfredo Bach and pt to importance of maximum level of alertness for po, precautions, compensatory strategies to mitigate dysphagia, dietary modifications and suspected premorbid dysphagia.  Pt admits her swallow ability is not baseline today and she appears sleepy but would answer questions.    SLP to follow for dysphagia management and family ed.    Thanks for this order.        Aspiration Risk  Moderate    Diet Recommendation Dysphagia 3 (Mechanical Soft);Thin liquid   Liquid Administration via: Cup;Straw Medication Administration: Crushed with puree Supervision: Full supervision/cueing for compensatory strategies;Patient able to self feed;Trained caregiver to feed patient Compensations: Slow rate;Small sips/bites;Check for pocketing;Follow solids with liquid (rest breaks if short of breath or coughing) Postural Changes and/or Swallow Maneuvers: Seated upright 90 degrees    Other  Recommendations Recommended Consults: MBS Oral Care Recommendations: Oral care QID   Follow Up Recommendations  Other (comment) (tbd)    Frequency and Duration min 2x/week  2 weeks   Pertinent Vitals/Pain Afebrile, room air, congested cough  SLP Swallow Goals Patient will utilize recommended strategies during swallow to increase swallowing safety with: Moderate cueing   Swallow Study Prior Functional Status   h/o dysphagia to pills for "years"    General Date of Onset: 05/13/12 HPI: 79 yo female adm to [facility] on 05-13-12 after being found down in bathroom by son Alfredo Bach.  Pt PMH + for dementia, smoker - heavy, breast cancer s/p left masectomy and medication per son - no radiation.  Pt  head CT 05-13-12 showed atrophy, parietal area skull laceration, C2-C3 fusion, and 1.9 cm mass in left valleculae/pirform sinus to level of vocal folds.  RN noted pt to have dysphagia and swallow evaluation was ordered.   Previous Swallow Assessment: none Diet Prior to this Study: Regular;Thin liquids Temperature Spikes Noted: No Respiratory Status: Room air History of Recent Intubation: No Behavior/Cognition: Lethargic (pt able to self feed with mod hand over hand assist) Oral Cavity - Dentition: Adequate natural dentition Self-Feeding Abilities: Needs assist Patient Positioning: Upright in bed Baseline Vocal Quality: Hoarse;Low vocal intensity (dysphonic x2 months per pt) Volitional Cough: Strong Volitional Swallow: Able to elicit    Oral/Motor/Sensory Function Overall Oral Motor/Sensory Function: Impaired (general weakness)   Ice Chips Ice chips: Not tested   Thin Liquid Thin Liquid: Impaired Presentation: Cup;Straw Oral Phase Impairments: Reduced lingual movement/coordination Oral Phase Functional Implications: Prolonged oral transit Pharyngeal  Phase Impairments: Suspected delayed Swallow;Multiple swallows;Decreased hyoid-laryngeal movement;Cough - Delayed Other Comments: delayed cough x1    Nectar Thick Nectar Thick Liquid: Not tested   Honey Thick Honey Thick Liquid: Not tested   Puree Puree: Impaired Presentation: Spoon Oral Phase Impairments: Reduced lingual movement/coordination Oral Phase Functional Implications: Prolonged oral transit Pharyngeal Phase Impairments: Suspected delayed Swallow;Multiple swallows   Solid   GO    Solid: Impaired Presentation: Self Fed Oral Phase Impairments: Reduced lingual movement/coordination;Impaired anterior to posterior transit Oral Phase Functional Implications: Oral residue Pharyngeal Phase Impairments: Suspected delayed Swallow;Multiple swallows;Cough - Delayed Other Comments: delayed cough x1       Donavan Burnet, MS [facility]  SLP [phone]

## 2012-05-17 NOTE — Progress Notes (Signed)
CRITICAL VALUE ALERT  Critical value received:  K+ is 2.9  Date of notification:  05/17/12  Time of notification:  06:25am  Critical value read back:no-lab did not call me, I noticed it on lab review myself  Nurse who received alert:  Carma Lair  MD notified (1st page):  M. Lynch,NP  Time of first page:  06:25am  MD notified (2nd page):  Time of second page:  Responding MD:  M. Lynch,NP  Time MD responded:  06:29am

## 2012-05-17 NOTE — Progress Notes (Signed)
SUBJECTIVE: Voices no complaints this am.   BP 94/59  Pulse 62  Temp(Src) 97.8 F (36.6 C) (Oral)  Resp 20  Ht 5' 5.5" (1.664 m)  Wt 115 lb (52.164 kg)  BMI 18.84 kg/m2  SpO2 96%  Intake/Output Summary (Last 24 hours) at 05/17/12 0857 Last data filed at 05/17/12 0500  Gross per 24 hour  Intake    240 ml  Output    800 ml  Net   -560 ml    PHYSICAL EXAM General: Well developed, well nourished, in no acute distress. Drowsy but responds to voice commands. Neck: No JVD. No masses noted.  Lungs: Rhonci bilaterally  Heart: RRR with loud systolic murmur noted. Abdomen: Bowel sounds are present. Soft, non-tender.  Extremities: No lower extremity edema.   LABS: Basic Metabolic Panel:  Recent Labs  78/46/96 0400 05/17/12 0433  NA 132* 130*  K 3.4* 2.9*  CL 99 93*  CO2 23 25  GLUCOSE 104* 145*  BUN 11 23  CREATININE 0.60 1.10  CALCIUM 9.0 9.3  MG  --  1.8   CBC:  Recent Labs  05/15/12 0400  WBC 6.8  HGB 14.2  HCT 40.6  MCV 92.7  PLT 130*   Cardiac Enzymes:  Recent Labs  05/15/12 2320 05/16/12 0515 05/16/12 1110  TROPONINI 1.49* 0.85* 0.81*   Fasting Lipid Panel:  Recent Labs  05/16/12 0515  CHOL 190  HDL 86  LDLCALC 86  TRIG 90  CHOLHDL 2.2    Current Meds: . amiodarone  200 mg Oral TID  . donepezil  5 mg Oral QHS  . folic acid  1 mg Oral Daily  . metoprolol succinate  100 mg Oral Daily  . multivitamin with minerals  1 tablet Oral Daily  . nicotine  21 mg Transdermal Daily  . potassium chloride  40 mEq Oral Q4H  . rivaroxaban  20 mg Oral Q breakfast  . sodium chloride  3 mL Intravenous Q12H  . sodium chloride  3 mL Intravenous Q12H  . thiamine  100 mg Oral Daily   Or  . thiamine  100 mg Intravenous Daily   Echo 05/14/12: Left ventricle: Rhythm is irregular. The LV is hyperdynamic with the EF 75-80%. There is upper septal thickening. There is a LV outflow gradient that is not recorded well. There is SAM of the anterior leaflet  of the mital valve. The LV may be underfilled. Wall motion was normal; there were no regional wall motion abnormalities. The study is not technically sufficient to allow evaluation of LV diastolic function. - Aortic valve: Sclerosis without stenosis. - Mitral valve: SAM of the anterior leaflet. Mild regurgitation. - Pulmonary arteries: PA peak pressure: 42mm Hg (S).    ASSESSMENT AND PLAN: 79 yo with history of HTN, hypertrophic cardiomyopathy, and smoking presented to ER after fall. She apparently has some degree of dementia (on Aricept) but is a reasonably good historian. Patient fell in her house and hit her head, so came to the ER. She tripped on a carpet and fell as she was walking down steps into her living room. No syncope. No prior falls. She sustained a head laceration but no severe injury. Initially, she was in NSR upon arrival to hospital. However, her BP was high in the hospital and she was given IV hydralazine. Her BP then plummeted to the 70s systolic and she went into atrial fibrillation with RVR. She has been asymptomatic.  1. Fall: Seems to have been mechanical (no syncope).  She tripped on a carpet.   2. Paroxysmal Atrial fibrillation: Currently in sinus. She went into atrial fibrillation the morning of admission after getting IV hydralazine for elevated BP. It is possible that use of a vasodilator (hydralazine) in the setting of HCM with LVOT obstruction led to increased LVOT gradient and abrupt fall in BP. This may have triggered the atrial fibrillation. She is unlikely to tolerate atrial fibrillation well if she has significant HCM. She is currently being treated with amiodarone po and maintaining sinus rhythm. She has been started on Xarelto for anti-coagulation.  (CHADSVASC = 4). Would d/c beta blocker with hypotension and bradycardia.   3. Hypertrophic cardiomyopathy: Evidence of outflow tract obstruction with hyperdynamic LV on echo.  Continue amiodarone which gives some  beta blockade. Would avoid pure vasodilators that drop afterload and increase LV outflow tract obstruction (such as hydralazine).   4. Non STEMI: Troponin trending down. Likely due to demand ischemia in settiing of a fib. No ischemic workup at this time.   5. Smoking: Counseled to quit.   6. ETOH use: Cessation recommended at this may have contributed to her fall. She had a significant ETOH level at admission. Watch for signs of withdrawal.   She will likely need SNF after discharge.    Tricia [name]  2/17/20148:57 AM

## 2012-05-18 ENCOUNTER — Inpatient Hospital Stay (HOSPITAL_COMMUNITY): Payer: Medicare Other

## 2012-05-18 LAB — BASIC METABOLIC PANEL
BUN: 41 mg/dL — ABNORMAL HIGH (ref 6–23)
Chloride: 95 mEq/L — ABNORMAL LOW (ref 96–112)
Creatinine, Ser: 1.21 mg/dL — ABNORMAL HIGH (ref 0.50–1.10)
GFR calc Af Amer: 46 mL/min — ABNORMAL LOW (ref >90)
GFR calc non Af Amer: 40 mL/min — ABNORMAL LOW (ref >90)
Glucose, Bld: 113 mg/dL — ABNORMAL HIGH (ref 70–99)

## 2012-05-18 MED ORDER — SODIUM CHLORIDE 0.9 % IV SOLN
INTRAVENOUS | Status: DC
  Administered 2012-05-18 – 2012-05-19 (×2): via INTRAVENOUS

## 2012-05-18 NOTE — Procedures (Signed)
Objective Swallowing Evaluation: Modified Barium Swallowing Study  Patient Details  Name: OVEDA DADAMO MRN: [id number] Date of Birth: 1925/05/14  Today's Date: 05/18/2012 Time: 4098-1191 SLP Time Calculation (min): 38 min  Past Medical History:  Past Medical History  Diagnosis Date  . Idiopathic hypertrophic subaortic stenosis   . Irregular heart beat   . Fibrocystic breast disease   . Tobacco abuse   . Hypertension   . History of colonoscopy   . Blood transfusion 10 yrs ago    after bleeding ulcer in stomach  . Heart murmur   . Cancer     left breast   Past Surgical History:  Past Surgical History  Procedure Laterality Date  . US echocardiography  12/05/2008    EF 60-65%  . US echocardiography  12/01/2007    EF 60-65%  . US echocardiography  07/01/2006    EF 55-60%  . US echocardiography  06/26/2005    EF 55-60%  . Cardiac catheterization  01/30/2002    EF 75-80%  . Appendectomy  1946  . Right breast benign lump removed  yrs ago  . Breast surgery  10/21/11    simple mastectomy   HPI:  79 yo female adm to [facility] on 05-13-12 after being found down in bathroom by son Alfredo Bach.  Pt PMH + for dementia, smoker - heavy, breast cancer s/p left masectomy and medication per son - no radiation.  Pt head CT 05-13-12 showed atrophy, parietal area skull laceration, C2-C3 fusion, and 1.9 cm mass in left valleculae/pirform sinus to level of vocal folds.  RN noted pt to have dysphagia and bedside swallow eval was completed recommending MBS due to findings on neck CT and dysphagia.       Assessment / Plan / Recommendation Clinical Impression  Dysphagia Diagnosis: Moderate oral phase dysphagia;Mild pharyngeal phase dysphagia Clinical impression: Pt presents with moderate oral and mild pharyngeal dysphagia.  Oral dysphagia characterized by decr oral control, cohesiveness resulting in delayed transit and oral stasis that prematurely spills into pharynx.  A single episode of aspiration (audible -  reflexive cough) when pt took large sequential boluses.  Reflexive cough did not clear aspirates.  Small single boluses prevented aspiration.  Mild tongue base and vallecular residuals with liquids noted without pt awareness, but cued dry swallows aid clearance.  Suspect pharyngeal residuals of liquids may be due to mass seen on neck CT, but could not determine and radiologist not present.    Unfortunately, pt will likely not recall strategies for airway protection as she did not retain aspiration episode within 20 seconds of education.    Rec continue diet with strict precautions and monitoring tolerance closely- modifying diet as indicated- downgrade to tsp amounts of thin or nectar liquids if pt not tolerating thin given audible aspiration on test.       Treatment Recommendation    TBD   Diet Recommendation Dysphagia 3 (Mechanical Soft);Thin liquid   Liquid Administration via: Cup Medication Administration: Crushed with puree Supervision: Full supervision/cueing for compensatory strategies Compensations: Slow rate;Small sips/bites;Multiple dry swallows after each bite/sip;Check for pocketing Postural Changes and/or Swallow Maneuvers: Seated upright 90 degrees;Upright 30-60 min after meal    Other  Recommendations Oral Care Recommendations: Oral care QID   Follow Up Recommendations  Skilled Nursing facility    Frequency and Duration min 2x/week  2 weeks   Pertinent Vitals/Pain Afebrile, congested    SLP Swallow Goals Patient will utilize recommended strategies during swallow to increase swallowing safety with: Maximal cueing  Swallow Study Goal #2 - Progress: Revised (modified due to lack of progress/goal met) (due to pt's dementia, goal modified)   General HPI: 79 yo female adm to [facility] on 05-13-12 after being found down in bathroom by son Alfredo Bach.  Pt PMH + for dementia, smoker - heavy, breast cancer s/p left masectomy and medication per son - no radiation.  Pt head CT 05-13-12 showed  atrophy, parietal area skull laceration, C2-C3 fusion, and 1.9 cm mass in left valleculae/pirform sinus to level of vocal folds.  RN noted pt to have dysphagia and bedside swallow eval was completed recommending MBS due to findings on neck CT and dysphagia.   Type of Study: Modified Barium Swallowing Study Reason for Referral: Objectively evaluate swallowing function Previous Swallow Assessment: BSE  Diet Prior to this Study: Dysphagia 3 (soft);Thin liquids Temperature Spikes Noted: No Respiratory Status: Room air History of Recent Intubation: No Behavior/Cognition: Alert;Cooperative;Distractible (pt has dementia) Oral Cavity - Dentition: Adequate natural dentition Oral Motor / Sensory Function: Impaired - see Bedside swallow eval Self-Feeding Abilities: Needs assist Patient Positioning: Upright in chair Baseline Vocal Quality: Hoarse;Low vocal intensity Volitional Cough: Strong Volitional Swallow: Able to elicit Anatomy:  (please see neck CT indicating pharyngeal mass) Pharyngeal Secretions: Not observed secondary MBS    Reason for Referral Objectively evaluate swallowing function   Oral Phase Oral Preparation/Oral Phase Oral Phase: Impaired Oral - Nectar Oral - Nectar Cup: Lingual pumping;Piecemeal swallowing;Delayed oral transit;Reduced posterior propulsion;Weak lingual manipulation;Lingual/palatal residue Oral - Thin Oral - Thin Teaspoon: Delayed oral transit;Piecemeal swallowing;Lingual/palatal residue;Weak lingual manipulation Oral - Thin Cup: Weak lingual manipulation;Delayed oral transit;Piecemeal swallowing;Lingual pumping;Lingual/palatal residue Oral - Solids Oral - Puree: Weak lingual manipulation;Delayed oral transit;Piecemeal swallowing;Lingual/palatal residue;Lingual pumping Oral - Regular: Weak lingual manipulation;Impaired mastication;Lingual pumping;Delayed oral transit   Pharyngeal Phase Pharyngeal Phase Pharyngeal Phase: Impaired Pharyngeal - Nectar Pharyngeal -  Nectar Cup: Pharyngeal residue - valleculae Pharyngeal - Thin Pharyngeal - Thin Teaspoon: Pharyngeal residue - valleculae Pharyngeal - Thin Cup: Pharyngeal residue - valleculae;Moderate aspiration;Compensatory strategies attempted (Comment);Penetration/Aspiration during swallow (small single sips prevented aspiration) Penetration/Aspiration details (thin cup): Material enters airway, passes BELOW cords and not ejected out despite cough attempt by patient Pharyngeal - Solids Pharyngeal - Puree: Within functional limits Pharyngeal Phase - Comment Pharyngeal Comment: pt with oral stasis that prematurely spills into pharynx without awareness, cued swallows aid clearance  Cervical Esophageal Phase    GO    Cervical Esophageal Phase Cervical Esophageal Phase: Impaired Cervical Esophageal Phase - Comment Cervical Esophageal Comment: appearance of prominent cricopharyngeus, minimal backflow x1 of thin liquid to piriform sinus         Donavan Burnet, MS [facility] SLP [phone]

## 2012-05-18 NOTE — Progress Notes (Addendum)
[facility] PROGRESS NOTE  Tricia [name] HYQ:[id number] DOB: 02/28/1926 DOA: 05/13/2012 PCP: [name], MD  Brief narrative  79 y/o female with heavy smoking, etoh use, dementia hx of hypertrophic CM presented after a mechanical fall at home. Patient given a dose of hydralazine on admission for elevated BP and developed hypotension with rapid A fib and transferred to SDU. Now having positive troponins.  Assessment/Plan:  Hypotension  Patient was given a dose of hydralazine for elevated blood pressure on admission and subsequently dropped her blood pressure as low as 70s systolic. She has a history of hypertrophic cardiomyopathy as per cardiology evaluation this morning she may have had a hypotensive episode due to further LV outflow tract obstruction due to afterload reduction with hydralazine. Blood pressure currently stable. Recommend avoiding afterload reducing agents.   A. fib with RVR  Likely triggered by her hypotensive episode. She has been started on amiodarone bolus followed by a drip. Now converted to NSR. Switched to po amiodarone A 2-D echo shows hyperdynamic LV with EF of 75-80%. Her CHADS- vascular is 4 and started on Xeralto by cardiology.  Patient bradycardic and given episodes of hypotension, BB held . Cardiology signed off and recommend outpatient followup with Dr. Patty Sermons.  NSTEMI  possibly in the setting of demand ischemia with hypotension and Afib. Denies any chest pain. Stable on tele. Monitor serial CE. Medical management per cardiology. Metoprolol held as heart rate low. lipid panel wnl  Troponin slowly trending down. No further we/up per cardiology   Fall  Mechanical. Has a right scalp laceration with staples placed in the ED. No active bleeding at this time.   Dementia with delirium  Patient lives with her son and as per him she does have moderate underlying dementia. However she has been very delirious  and periodically agitated and combative. Her  CIWA scale quite elevated and requiring Ativan with good effect. Patient also drinks on a daily basis (as per son has one drink per day however her alcohol level on presentation was quite elevated. She also smokes 2-3 packs of cigarettes per day.  -Symptoms much better today. Continue thiamine and multivitamin  Monitor for withdrawal symptoms and DTs   Hypokalemia  Replenished   ? Dysphagia  Patient noticed to have difficulty swallowing as per nurse. Speech and swallow evaluation Appreciated. At [facility] today and Recommend dys level 3 diet . 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 Given hx of heavy smoking and this is concerning . I have spoken with ENT consult Dr. Annalee Genta who recommends to have patient followup in the clinic in about 4 weeks for possible  indirect endoscopy and also can evaluate how she does on current dysphagia diet.   Acute Kidney injury Mild and noted in a.m. labs. Likely prerenal in the setting of poor by mouth intake last few days. Will order gentle hydration overnight and monitor in a.m.   Dementia  Moderate as per son.. Continue with Namenda   Tobacco use  Counseled on smoking cessation .smokes almost over 2 packs per day. Nicotine patch ordered   Code Status: Full code  Family Communication: son at bedside  Disposition Plan: SNF for PT. Social work involved. Can likely be discharged tomorrow if bed available   Consultants:  [facility] cardiology) Procedures:  2-D echo  Antibiotics:  None  HPI/Subjective:  Has been very sleepy with ativan overnight. Noted for short pauses of  tele   Objective: Filed Vitals:   05/17/12 1312 05/17/12 2053 05/18/12 0553 05/18/12 1427  BP: 130/68 101/61 132/70 135/77  Pulse: 60 60 60 60  Temp: 97.7 F (36.5 C) 98.1 F (36.7 C) 97.5 F (36.4 C) 98.5 F (36.9 C)  TempSrc: Oral Oral Oral Oral  Resp: 20 18 18 18   Height:       Weight:      SpO2: 94% 95% 93% 95%    Intake/Output Summary (Last 24 hours) at 05/18/12 1637 Last data filed at 05/18/12 1400  Gross per 24 hour  Intake    720 ml  Output    401 ml  Net    319 ml   Filed Weights   05/14/12 0335 05/14/12 0620 05/15/12 0600  Weight: 49.4 kg (108 lb 14.5 oz) 51 kg (112 lb 7 oz) 52.164 kg (115 lb)    Exam:  General: Elderly female lying in bed in no acute distress .  HEENT: Right scalp laceration with staples placed, no active bleeding, no pallor moist oral mucosa  Cardiovascular: S1 and S2 regular with loud 4/6 systolic murmur  Respiratory: Clear to auscultation bilaterally no added, status post left mastectomy  Abdomen: Soft, nontender, nondistended sounds present  Extremities: Warm, no edema , poorly kept toenails  CNS: AAO x2, calm and more interactive today.   Data Reviewed: Basic Metabolic Panel:  Recent Labs Lab 05/13/12 2030 05/14/12 0433 05/15/12 0400 05/17/12 0433 05/18/12 0433  NA 136 137 132* 130* 131*  K 3.9 3.6 3.4* 2.9* 4.3  CL 102 97 99 93* 95*  CO2  --  30 23 25 25   GLUCOSE 95 113* 104* 145* 113*  BUN 14 12 11 23  41*  CREATININE 1.00 0.65 0.60 1.10 1.21*  CALCIUM  --  9.7 9.0 9.3 9.2  MG  --   --   --  1.8  --    Liver Function Tests: No results found for this basename: AST, ALT, ALKPHOS, BILITOT, PROT, ALBUMIN,  in the last 168 hours No results found for this basename: LIPASE, AMYLASE,  in the last 168 hours No results found for this basename: AMMONIA,  in the last 168 hours CBC:  Recent Labs Lab 05/13/12 2010 05/13/12 2030 05/14/12 0433 05/15/12 0400  WBC 6.3  --  8.7 6.8  NEUTROABS 4.3  --   --   --   HGB 15.5* 16.0* 15.8* 14.2  HCT 43.6 47.0* 45.5 40.6  MCV 92.8  --  92.9 92.7  PLT 160  --  169 130*   Cardiac Enzymes:  Recent Labs Lab 05/14/12 1250 05/14/12 1912 05/15/12 2320 05/16/12 0515 05/16/12 1110  TROPONINI 2.55* 4.12* 1.49* 0.85* 0.81*   BNP (last 3 results) No results found  for this basename: PROBNP,  in the last 8760 hours CBG: No results found for this basename: GLUCAP,  in the last 168 hours  Recent Results (from the past 240 hour(s))  MRSA PCR SCREENING     Status: None   Collection Time    05/14/12  6:50 AM      Result Value Range Status   MRSA by PCR NEGATIVE  NEGATIVE Final   Comment:            The GeneXpert MRSA Assay (FDA     approved for NASAL specimens     only), is one component of a     comprehensive MRSA colonization     surveillance program. It is not  intended to diagnose MRSA     infection nor to guide or     monitor treatment for     MRSA infections.     Studies: Dg Swallowing Func-speech Pathology  05/18/2012  Chales Abrahams, CCC-SLP     05/18/2012 12:28 PM Objective Swallowing Evaluation: Modified Barium Swallowing Study   Patient Details  Name: TRENESHA ALCAIDE MRN: [id number] Date of Birth: [birth year]  Today's Date: 05/18/2012 Time: 4098-1191 SLP Time Calculation (min): 38 min  Past Medical History:  Past Medical History  Diagnosis Date  . Idiopathic hypertrophic subaortic stenosis   . Irregular heart beat   . Fibrocystic breast disease   . Tobacco abuse   . Hypertension   . History of colonoscopy   . Blood transfusion 10 yrs ago    after bleeding ulcer in stomach  . Heart murmur   . Cancer     left breast   Past Surgical History:  Past Surgical History  Procedure Laterality Date  . US echocardiography  12/05/2008    EF 60-65%  . US echocardiography  12/01/2007    EF 60-65%  . US echocardiography  07/01/2006    EF 55-60%  . US echocardiography  06/26/2005    EF 55-60%  . Cardiac catheterization  01/30/2002    EF 75-80%  . Appendectomy  1946  . Right breast benign lump removed  yrs ago  . Breast surgery  10/21/11    simple mastectomy   HPI:  79 yo female adm to [facility] on 05-13-12 after being found down in  bathroom by son Alfredo Bach.  Pt PMH + for dementia, smoker - heavy,  breast cancer s/p left masectomy and medication per son - no  radiation.  Pt head  CT 05-13-12 showed atrophy, parietal area  skull laceration, C2-C3 fusion, and 1.9 cm mass in left  valleculae/pirform sinus to level of vocal folds.  RN noted pt to  have dysphagia and bedside swallow eval was completed  recommending MBS due to findings on neck CT and dysphagia.       Assessment / Plan / Recommendation Clinical Impression  Dysphagia Diagnosis: Moderate oral phase dysphagia;Mild  pharyngeal phase dysphagia Clinical impression: Pt presents with moderate oral and mild  pharyngeal dysphagia.  Oral dysphagia characterized by decr oral  control, cohesiveness resulting in delayed transit and oral  stasis that prematurely spills into pharynx.  A single episode of  aspiration (audible - reflexive cough) when pt took large  sequential boluses.  Reflexive cough did not clear aspirates.   Small single boluses prevented aspiration.  Mild tongue base and  vallecular residuals with liquids noted without pt awareness, but  cued dry swallows aid clearance.  Suspect pharyngeal residuals of  liquids may be due to mass seen on neck CT, but could not  determine and radiologist not present.    Unfortunately, pt will likely not recall strategies for airway  protection as she did not retain aspiration episode within 20  seconds of education.    Rec continue diet with strict precautions and monitoring  tolerance closely- modifying diet as indicated- downgrade to tsp  amounts of thin or nectar liquids if pt not tolerating thin given  audible aspiration on test.       Treatment Recommendation    TBD   Diet Recommendation Dysphagia 3 (Mechanical Soft);Thin liquid   Liquid Administration via: Cup Medication Administration: Crushed with puree Supervision: Full supervision/cueing for compensatory strategies Compensations: Slow rate;Small sips/bites;Multiple dry swallows  after each  bite/sip;Check for pocketing Postural Changes and/or Swallow Maneuvers: Seated upright 90  degrees;Upright 30-60 min after meal    Other   Recommendations Oral Care Recommendations: Oral care QID   Follow Up Recommendations  Skilled Nursing facility    Frequency and Duration min 2x/week  2 weeks   Pertinent Vitals/Pain Afebrile, congested    SLP Swallow Goals Patient will utilize recommended strategies during swallow to  increase swallowing safety with: Maximal cueing Swallow Study Goal #2 - Progress: Revised (modified due to lack  of progress/goal met) (due to pt's dementia, goal modified)   General HPI: 79 yo female adm to [facility] on 05-13-12 after being found  down in bathroom by son Alfredo Bach.  Pt PMH + for dementia, smoker -  heavy, breast cancer s/p left masectomy and medication per son -  no radiation.  Pt head CT 05-13-12 showed atrophy, parietal area  skull laceration, C2-C3 fusion, and 1.9 cm mass in left  valleculae/pirform sinus to level of vocal folds.  RN noted pt to  have dysphagia and bedside swallow eval was completed  recommending MBS due to findings on neck CT and dysphagia.   Type of Study: Modified Barium Swallowing Study Reason for Referral: Objectively evaluate swallowing function Previous Swallow Assessment: BSE  Diet Prior to this Study: Dysphagia 3 (soft);Thin liquids Temperature Spikes Noted: No Respiratory Status: Room air History of Recent Intubation: No Behavior/Cognition: Alert;Cooperative;Distractible (pt has  dementia) Oral Cavity - Dentition: Adequate natural dentition Oral Motor / Sensory Function: Impaired - see Bedside swallow  eval Self-Feeding Abilities: Needs assist Patient Positioning: Upright in chair Baseline Vocal Quality: Hoarse;Low vocal intensity Volitional Cough: Strong Volitional Swallow: Able to elicit Anatomy:  (please see neck CT indicating pharyngeal mass) Pharyngeal Secretions: Not observed secondary MBS    Reason for Referral Objectively evaluate swallowing function   Oral Phase Oral Preparation/Oral Phase Oral Phase: Impaired Oral - Nectar Oral - Nectar Cup: Lingual pumping;Piecemeal swallowing;Delayed   oral transit;Reduced posterior propulsion;Weak lingual  manipulation;Lingual/palatal residue Oral - Thin Oral - Thin Teaspoon: Delayed oral transit;Piecemeal  swallowing;Lingual/palatal residue;Weak lingual manipulation Oral - Thin Cup: Weak lingual manipulation;Delayed oral  transit;Piecemeal swallowing;Lingual pumping;Lingual/palatal  residue Oral - Solids Oral - Puree: Weak lingual manipulation;Delayed oral  transit;Piecemeal swallowing;Lingual/palatal residue;Lingual  pumping Oral - Regular: Weak lingual manipulation;Impaired  mastication;Lingual pumping;Delayed oral transit   Pharyngeal Phase Pharyngeal Phase Pharyngeal Phase: Impaired Pharyngeal - Nectar Pharyngeal - Nectar Cup: Pharyngeal residue - valleculae Pharyngeal - Thin Pharyngeal - Thin Teaspoon: Pharyngeal residue - valleculae Pharyngeal - Thin Cup: Pharyngeal residue - valleculae;Moderate  aspiration;Compensatory strategies attempted  (Comment);Penetration/Aspiration during swallow (small single  sips prevented aspiration) Penetration/Aspiration details (thin cup): Material enters  airway, passes BELOW cords and not ejected out despite cough  attempt by patient Pharyngeal - Solids Pharyngeal - Puree: Within functional limits Pharyngeal Phase - Comment Pharyngeal Comment: pt with oral stasis that prematurely spills  into pharynx without awareness, cued swallows aid clearance  Cervical Esophageal Phase    GO    Cervical Esophageal Phase Cervical Esophageal Phase: Impaired Cervical Esophageal Phase - Comment Cervical Esophageal Comment: appearance of prominent  cricopharyngeus, minimal backflow x1 of thin liquid to piriform  sinus         Donavan Burnet, MS [facility] SLP [phone]      Scheduled Meds: . amiodarone  200 mg Oral TID  . donepezil  5 mg Oral QHS  . folic acid  1 mg Oral Daily  . metoprolol succinate  100 mg Oral Daily  .  multivitamin with minerals  1 tablet Oral Daily  . nicotine  21 mg Transdermal Daily  . rivaroxaban  20 mg Oral Q  breakfast  . sodium chloride  3 mL Intravenous Q12H  . sodium chloride  3 mL Intravenous Q12H  . thiamine  100 mg Oral Daily   Or  . thiamine  100 mg Intravenous Daily   Continuous Infusions:      Time spent: 25 minutes    Lilah Mijangos  [facility] Pager [phone] If 8PM-8AM, please contact night-coverage at [email or web address], password [facility] 05/18/2012, 4:37 PM  LOS: 5 days

## 2012-05-18 NOTE — Progress Notes (Signed)
Clinical Social Work Department BRIEF PSYCHOSOCIAL ASSESSMENT 05/18/2012  Patient:  Tricia [name], Tricia [name]     Account Number:  [id number]     Admit date:  05/13/2012  Clinical Social Worker:  Orpah Greek  Date/Time:  05/18/2012 11:30 AM  Referred by:  Physician  Date Referred:  05/18/2012 Referred for  SNF Placement   Other Referral:   Interview type:  Family Other interview type:    PSYCHOSOCIAL DATA Living Status:  WITH ADULT CHILDREN Admitted from facility:   Level of care:   Primary support name:  Bryli Mantey (son) h#: [phone] Primary support relationship to patient:  CHILD, ADULT Degree of support available:   good    CURRENT CONCERNS Current Concerns  Post-Acute Placement   Other Concerns:    SOCIAL WORK ASSESSMENT / PLAN CSW spoke with patient's son at bedside (patient is out of room currently having swallow study done) re: discharge planning. Patient's son states that she lives with him, but that he feels he cannot take care of her in her current condition.   Assessment/plan status:  Information/Referral to [facility] Other assessment/ plan:   Information/referral to community resources:   CSW completed FL2 and faxed information out to [facility] - provided son with bed offers.    PATIENT'S/FAMILY'S RESPONSE TO PLAN OF CARE: Patient's son states that she was not happy with the idea of going to a rehab, but realizes that she needs to go for a short stay. CSW will follow-up tomorrow with SNF decision. Son is leaning towards [facility]. Anticipating discharge tomorrow.        Unice Bailey, LCSW [facility] Clinical Social Worker cell #: [phone]

## 2012-05-18 NOTE — Evaluation (Signed)
Physical Therapy Evaluation Patient Details Name: Tricia [name] MRN: [id number] DOB: [birth year] Today's Date: 05/18/2012 Time: 4098-1191 PT Time Calculation (min): 14 min  PT Assessment / Plan / Recommendation Clinical Impression  Pt admitted for hypotension and mechanical fall at home with laceration to scalp.  Pt requires increased assist for transfers and ambulation as well as verbal cues for safety.  Pt reports previously being indepedent at home and having bedroom upstairs.   Pt would benefit from acute PT services in order to improve independence and safety with transfers and ambulation to prepare for d/c to next venue.    PT Assessment  Patient needs continued PT services    Follow Up Recommendations  SNF    Does the patient have the potential to tolerate intense rehabilitation      Barriers to Discharge        Equipment Recommendations  Rolling walker with 5" wheels    Recommendations for Other Services OT consult   Frequency Min 3X/week    Precautions / Restrictions Precautions Precautions: Fall   Pertinent Vitals/Pain n/a     Mobility  Bed Mobility Bed Mobility: Supine to Sit Supine to Sit: 4: Min guard;HOB elevated;With rails Details for Bed Mobility Assistance: increased time and effort Transfers Transfers: Sit to Stand;Stand to Sit Sit to Stand: 4: Min assist;With upper extremity assist Stand to Sit: 3: Mod assist;With upper extremity assist Details for Transfer Assistance: +2 assist for safety, verbal cues for safe technique, assist to rise and increased assist for safety sitting due to sitting while turning despite cues Ambulation/Gait Ambulation/Gait Assistance: 1: +2 Total assist Ambulation/Gait: Patient Percentage: 60% Ambulation Distance (Feet): 80 Feet Assistive device: Rolling walker Ambulation/Gait Assistance Details: pt with increased assist due to semisideways ambulation with L side of body leading gait Gait Pattern: Step-through  pattern;Decreased step length - right;Trunk rotated posteriorly on right Gait velocity: decreased    Exercises     PT Diagnosis: Difficulty walking  PT Problem List: Decreased strength;Decreased activity tolerance;Decreased mobility;Decreased balance;Decreased safety awareness;Decreased knowledge of use of DME;Decreased cognition PT Treatment Interventions: Gait training;DME instruction;Functional mobility training;Therapeutic activities;Therapeutic exercise;Neuromuscular re-education;Balance training;Patient/family education   PT Goals Acute Rehab PT Goals PT Goal Formulation: With patient Time For Goal Achievement: 05/25/12 Potential to Achieve Goals: Good Pt will go Supine/Side to Sit: with supervision PT Goal: Supine/Side to Sit - Progress: Goal set today Pt will go Sit to Supine/Side: with supervision PT Goal: Sit to Supine/Side - Progress: Goal set today Pt will go Sit to Stand: with supervision PT Goal: Sit to Stand - Progress: Goal set today Pt will go Stand to Sit: with supervision PT Goal: Stand to Sit - Progress: Goal set today Pt will Ambulate: 51 - 150 feet;with min assist;with least restrictive assistive device PT Goal: Ambulate - Progress: Goal set today  Visit Information  Last PT Received On: 05/18/12 Assistance Needed: +2    Subjective Data  Subjective: I haven't been walking since Saturday, so I'm a little weak.   Prior Functioning  Home Living Lives With: Son Type of Home: House Home Access: Stairs to enter Secretary/administrator of Steps: 4 Home Layout: Two level Alternate Level Stairs-Number of Steps: 14 Home Adaptive Equipment: None Prior Function Level of Independence: Independent Comments: Pt reports independent with mobility and ADLs Communication Communication: No difficulties    Cognition  Cognition Overall Cognitive Status: Impaired Area of Impairment: Safety/judgement;Awareness of deficits Arousal/Alertness: Awake/alert Orientation  Level: Appears intact for tasks assessed Behavior During Session: [facility] for tasks  performed Safety/Judgement: Decreased awareness of need for assistance;Decreased safety judgement for tasks assessed;Decreased awareness of safety precautions    Extremity/Trunk Assessment Right Lower Extremity Assessment RLE ROM/Strength/Tone: Deficits RLE ROM/Strength/Tone Deficits: functional weakness observed, grossly at least 3+/5 throughout Left Lower Extremity Assessment LLE ROM/Strength/Tone: Deficits LLE ROM/Strength/Tone Deficits: functional weakness observed, grossly at least 3+/5 throughout   Balance    End of Session PT - End of Session Equipment Utilized During Treatment: Gait belt Activity Tolerance: Patient limited by fatigue Patient left: in chair;with call bell/phone within reach;with chair alarm set Nurse Communication: Mobility status  GP     Tricia [name],KATHrine E 05/18/2012, 3:07 PM Zenovia Jarred, PT, DPT 05/18/2012 Pager: [phone]

## 2012-05-18 NOTE — Progress Notes (Signed)
Patient's son states that she was not happy with the idea of going to a rehab, but realizes that she needs to go for a short stay. CSW will follow-up tomorrow with SNF decision. Son is leaning towards [facility]. Anticipating discharge tomorrow.   Clinical Social Work Department CLINICAL SOCIAL WORK PLACEMENT NOTE 05/18/2012  Patient:  Tricia [name], Tricia [name]  Account Number:  [id number] Admit date:  05/13/2012  Clinical Social Worker:  Orpah Greek  Date/time:  05/18/2012 11:37 AM  Clinical Social Work is seeking post-discharge placement for this patient at the following level of care:   SKILLED NURSING   (*CSW will update this form in Epic as items are completed)   05/18/2012  Patient/family provided with [facility] Department of Clinical Social Work's list of facilities offering this level of care within the geographic area requested by the patient (or if unable, by the patient's family).  05/18/2012  Patient/family informed of their freedom to choose among providers that offer the needed level of care, that participate in Medicare, Medicaid or managed care program needed by the patient, have an available bed and are willing to accept the patient.  05/18/2012  Patient/family informed of [facility]' ownership interest in [facility], as well as of the fact that they are under no obligation to receive care at this facility.  PASARR submitted to EDS on 05/18/2012 PASARR number received from EDS on 05/18/2012  FL2 transmitted to all facilities in geographic area requested by pt/family on  05/18/2012 FL2 transmitted to all facilities within larger geographic area on   Patient informed that his/her managed care company has contracts with or will negotiate with  certain facilities, including the following:     Patient/family informed of bed offers received:  05/18/2012 Patient chooses bed at  Physician recommends and patient chooses bed at    Patient to  be transferred to  on   Patient to be transferred to facility by   The following physician request were entered in Epic:   Additional Comments:  Unice Bailey, LCSW [facility] Clinical Social Worker cell #: [phone]

## 2012-05-18 NOTE — Progress Notes (Signed)
SUBJECTIVE: She looks much better this am. Denies chest pain or SOB.   BP 132/70  Pulse 60  Temp(Src) 97.5 F (36.4 C) (Oral)  Resp 18  Ht 5' 5.5" (1.664 m)  Wt 115 lb (52.164 kg)  BMI 18.84 kg/m2  SpO2 93%  Intake/Output Summary (Last 24 hours) at 05/18/12 1191 Last data filed at 05/18/12 0553  Gross per 24 hour  Intake    240 ml  Output    351 ml  Net   -111 ml    PHYSICAL EXAM General: Well developed, well nourished, in no acute distress. Alert and oriented x 3.  Psych:  Good affect, responds appropriately Neck: No JVD. No masses noted.  Lungs: Rhonci bilaterally  Heart: RRR with loud systolic murmur noted.  Abdomen: Bowel sounds are present. Soft, non-tender.  Extremities: No lower extremity edema.   LABS: Basic Metabolic Panel:  Recent Labs  47/82/95 0433 05/18/12 0433  NA 130* 131*  K 2.9* 4.3  CL 93* 95*  CO2 25 25  GLUCOSE 145* 113*  BUN 23 41*  CREATININE 1.10 1.21*  CALCIUM 9.3 9.2  MG 1.8  --    CBC: No results found for this basename: WBC, NEUTROABS, HGB, HCT, MCV, PLT,  in the last 72 hours Cardiac Enzymes:  Recent Labs  05/15/12 2320 05/16/12 0515 05/16/12 1110  TROPONINI 1.49* 0.85* 0.81*   Fasting Lipid Panel:  Recent Labs  05/16/12 0515  CHOL 190  HDL 86  LDLCALC 86  TRIG 90  CHOLHDL 2.2    Current Meds: . amiodarone  200 mg Oral TID  . donepezil  5 mg Oral QHS  . folic acid  1 mg Oral Daily  . metoprolol succinate  100 mg Oral Daily  . multivitamin with minerals  1 tablet Oral Daily  . nicotine  21 mg Transdermal Daily  . rivaroxaban  20 mg Oral Q breakfast  . sodium chloride  3 mL Intravenous Q12H  . sodium chloride  3 mL Intravenous Q12H  . thiamine  100 mg Oral Daily   Or  . thiamine  100 mg Intravenous Daily   Echo 05/14/12:  Left ventricle: Rhythm is irregular. The LV is hyperdynamic with the EF 75-80%. There is upper septal thickening. There is a LV outflow gradient that is not recorded well. There is  SAM of the anterior leaflet of the mital valve. The LV may be underfilled. Wall motion was normal; there were no regional wall motion abnormalities. The study is not technically sufficient to allow evaluation of LV diastolic function. - Aortic valve: Sclerosis without stenosis. - Mitral valve: SAM of the anterior leaflet. Mild regurgitation. - Pulmonary arteries: PA peak pressure: 42mm Hg (S).  ASSESSMENT AND PLAN: 79 yo with history of HTN, hypertrophic cardiomyopathy, and smoking presented to ER after fall. She apparently has some degree of dementia (on Aricept) but is a reasonably good historian. Patient fell in her house and hit her head, so came to the ER. She tripped on a carpet and fell as she was walking down steps into her living room. No syncope. No prior falls. She sustained a head laceration but no severe injury. Initially, she was in NSR upon arrival to hospital. However, her BP was high in the hospital and she was given IV hydralazine. Her BP then plummeted to the 70s systolic and she went into atrial fibrillation with RVR. She has been asymptomatic.   1. Fall: Seems to have been mechanical (no syncope). She  tripped on a carpet.   2. Paroxysmal Atrial fibrillation: Currently in sinus. She went into atrial fibrillation the morning of admission after getting IV hydralazine for elevated BP. It is possible that use of a vasodilator (hydralazine) in the setting of HCM with LVOT obstruction led to increased LVOT gradient and abrupt fall in BP. This may have triggered the atrial fibrillation. She is unlikely to tolerate atrial fibrillation well if she has significant HCM. She is currently being treated with amiodarone po and maintaining sinus rhythm. She has been started on Xarelto for anti-coagulation. (CHADSVASC = 4). Would d/c beta blocker with hypotension and bradycardia.   3. Hypertrophic cardiomyopathy: Evidence of outflow tract obstruction with hyperdynamic LV on echo. Continue  amiodarone which gives some beta blockade. Would avoid pure vasodilators that drop afterload and increase LV outflow tract obstruction (such as hydralazine).   4. Non STEMI: Troponin trending down. Likely due to demand ischemia in settiing of a fib. No ischemic workup at this time.   5. Smoking: Counseled to quit.   6. ETOH use: Cessation recommended at this may have contributed to her fall. She had a significant ETOH level at admission. Watch for signs of withdrawal.   Will sign off for now. She can follow up with Dr Ronny Flurry in our [facility] office after discharge. Please call if there are further questions during her hospital stay.     MCALHANY,CHRISTOPHER  2/18/20146:43 AM

## 2012-05-19 DIAGNOSIS — I119 Hypertensive heart disease without heart failure: Secondary | ICD-10-CM

## 2012-05-19 LAB — BASIC METABOLIC PANEL
BUN: 34 mg/dL — ABNORMAL HIGH (ref 6–23)
CO2: 23 mEq/L (ref 19–32)
Calcium: 8.9 mg/dL (ref 8.4–10.5)
Creatinine, Ser: 0.92 mg/dL (ref 0.50–1.10)

## 2012-05-19 MED ORDER — RIVAROXABAN 20 MG PO TABS: 20.0000 mg | ORAL_TABLET | Freq: Every day | ORAL | Status: DC

## 2012-05-19 MED ORDER — AMIODARONE HCL 200 MG PO TABS: 200.0000 mg | ORAL_TABLET | Freq: Three times a day (TID) | ORAL | Status: DC

## 2012-05-19 NOTE — Plan of Care (Signed)
Problem: Phase II Progression Outcomes Goal: Discharge plan established Outcome: Progressing SNF placement

## 2012-05-19 NOTE — Progress Notes (Signed)
Physical Therapy Treatment Patient Details Name: Tricia [name] MRN: [id number] DOB: 1925/08/19 Today's Date: 05/19/2012 Time: 9147-8295 PT Time Calculation (min): 24 min  PT Assessment / Plan / Recommendation Comments on Treatment Session  Pt able to tolerate increasing ambulation distance today and performed exercises in recliner.  Pt plans to d/c to SNF today.    Follow Up Recommendations  SNF     Does the patient have the potential to tolerate intense rehabilitation     Barriers to Discharge        Equipment Recommendations  Rolling walker with 5" wheels    Recommendations for Other Services    Frequency     Plan Discharge plan remains appropriate;Frequency remains appropriate    Precautions / Restrictions Precautions Precautions: Fall   Pertinent Vitals/Pain No pain     Mobility  Bed Mobility Bed Mobility: Supine to Sit Supine to Sit: 5: Supervision;HOB elevated Details for Bed Mobility Assistance: increased time and effort Transfers Transfers: Sit to Stand;Stand to Sit Sit to Stand: 4: Min assist;With upper extremity assist;From bed Stand to Sit: With upper extremity assist;4: Min assist;To chair/3-in-1 Details for Transfer Assistance: verbal cues for safe technique, assist to rise, pt turning and reaching for armrest instead of keeping RW and turning so she sat on bed and educated pt to turn around holding RW until back up against chair and then reach back for armrest Ambulation/Gait Ambulation/Gait Assistance: 4: Min assist Ambulation Distance (Feet): 260 Feet Assistive device: Rolling walker Ambulation/Gait Assistance Details: pt able to tolerate increasing distance today, continues to ambulate semisideways with L side of body leading gait, min assist for LOB x2 and especially with turning Gait Pattern: Step-through pattern;Decreased step length - right;Trunk rotated posteriorly on right Gait velocity: decreased    Exercises General Exercises - Lower  Extremity Ankle Circles/Pumps: AROM;Both;15 reps Long Arc Quad: AROM;Both;20 reps;Seated Heel Slides: AROM;Both;20 reps;Supine Hip ABduction/ADduction: AROM;Both;20 reps;Supine Straight Leg Raises: AROM;Both;20 reps;Supine Hip Flexion/Marching: AROM;Both;20 reps;Seated   PT Diagnosis:    PT Problem List:   PT Treatment Interventions:     PT Goals Acute Rehab PT Goals PT Goal: Supine/Side to Sit - Progress: Met PT Goal: Sit to Stand - Progress: Progressing toward goal PT Goal: Stand to Sit - Progress: Progressing toward goal PT Goal: Ambulate - Progress: Progressing toward goal  Visit Information  Last PT Received On: 05/19/12 Assistance Needed: +1    Subjective Data  Subjective: It's good to get out of this bed.   Cognition  Cognition Overall Cognitive Status: Impaired Area of Impairment: Safety/judgement;Awareness of deficits Arousal/Alertness: Awake/alert Orientation Level: Appears intact for tasks assessed Behavior During Session: [facility] for tasks performed Safety/Judgement: Decreased awareness of need for assistance;Decreased safety judgement for tasks assessed;Decreased awareness of safety precautions    Balance     End of Session PT - End of Session Equipment Utilized During Treatment: Gait belt Activity Tolerance: Patient limited by fatigue Patient left: in chair;with call bell/phone within reach;with chair alarm set;with nursing in room   GP     Sally Reimers,KATHrine E 05/19/2012, 12:26 PM Zenovia Jarred, PT, DPT 05/19/2012 Pager: [phone]

## 2012-05-19 NOTE — Progress Notes (Signed)
Patient is set to discharge to [facility] today. Patient & son at bedside and aware. [facility] called for transport.   Clinical Social Work Department CLINICAL SOCIAL WORK PLACEMENT NOTE 05/19/2012  Patient:  MAHROSH, DONNELL  Account Number:  [id number] Admit date:  05/13/2012  Clinical Social Worker:  Orpah Greek  Date/time:  05/18/2012 11:37 AM  Clinical Social Work is seeking post-discharge placement for this patient at the following level of care:   SKILLED NURSING   (*CSW will update this form in Epic as items are completed)   05/18/2012  Patient/family provided with [facility] Department of Clinical Social Work's list of facilities offering this level of care within the geographic area requested by the patient (or if unable, by the patient's family).  05/18/2012  Patient/family informed of their freedom to choose among providers that offer the needed level of care, that participate in Medicare, Medicaid or managed care program needed by the patient, have an available bed and are willing to accept the patient.  05/18/2012  Patient/family informed of [facility]' ownership interest in [facility], as well as of the fact that they are under no obligation to receive care at this facility.  PASARR submitted to EDS on 05/18/2012 PASARR number received from EDS on 05/18/2012  FL2 transmitted to all facilities in geographic area requested by pt/family on  05/18/2012 FL2 transmitted to all facilities within larger geographic area on   Patient informed that his/her managed care company has contracts with or will negotiate with  certain facilities, including the following:     Patient/family informed of bed offers received:  05/18/2012 Patient chooses bed at [facility] [place] Physician recommends and patient chooses bed at    Patient to be transferred to [facility] [place] on  05/19/2012 Patient to be transferred to  facility by [facility]  The following physician request were entered in Epic:   Additional Comments:  Unice Bailey, LCSW [facility] Clinical Social Worker cell #: [phone]

## 2012-05-19 NOTE — Progress Notes (Signed)
Speech Language Pathology Dysphagia Treatment Patient Details Name: [name] MRN: [id number] DOB: 16-Mar-1926 Today's Date: 05/19/2012 Time: 1191-4782 SLP Time Calculation (min): 14 min  Assessment / Plan / Recommendation Clinical Impression  Purpose of SLP visit was to educate son and pt findings of MBS using diagram, review of multiple risk factors for asp pnas (reliance on others for feeding, pulmonary status, ambulatory status) and compensatory strategies to mitigate dysphagia.  Son and pt verbalized understanding to information but pt's cognition prevents her from carryover of strategies and  information retention.  Pt will require monitoring of po tolerance and diet modification if she is not tolerating thin liquids.   Rec follow up at [facility] for dysphagia management.     Diet Recommendation  Continue with Current Diet: Dysphagia 3 (mechanical soft);Thin liquid    SLP Plan All goals met   Pertinent Vitals/Pain Afebrile, decreased   Swallowing Goals   all goals met  General Temperature Spikes Noted: No Respiratory Status: Room air Behavior/Cognition: Alert Oral Cavity - Dentition: Adequate natural dentition Patient Positioning: Upright in chair  Oral Cavity - Oral Hygiene   pt with clean oral cavity  Dysphagia Treatment Treatment focused on: Patient/family/caregiver education Family/Caregiver Educated: son Tricia [name] Patient observed directly with PO's: Yes Type of PO's observed: Thin liquids Feeding: Able to feed self Liquids provided via: Straw Pharyngeal Phase Signs & Symptoms: Delayed cough Type of cueing: Verbal Amount of cueing: Total (pt can not recall information secondary to dementia)   GO     Tricia Burnet, MS [facility] SLP [phone]

## 2012-05-19 NOTE — Discharge Summary (Signed)
Physician Discharge Summary  Tricia [name]:[id number] DOB: 10-05-1925 DOA: 05/13/2012  PCP: [name], MD  Admit date: 05/13/2012 Discharge date: 05/19/2012  Recommendations for Outpatient Follow-up:  1. Pt will need to follow up with PCP in 2-3 weeks post discharge 2. Please obtain BMP to evaluate electrolytes and kidney function 3. Please also check CBC to evaluate Hg and Hct levels 4. Please note that Aspirin and Metoprolol were stopped as recommended by cardiology team 5. Pt was started on Amiodarone and Xarelto  Discharge Diagnoses: Mechanical fall, atrial fibrillation with RVR Principal Problem:   Near syncope Active Problems:   IHSS (idiopathic hypertrophic subaortic stenosis)   Benign hypertensive heart disease without heart failure   Scalp laceration   Fall at home   Rapid atrial fibrillation   Hypotension, unspecified   NSTEMI (non-ST elevated myocardial infarction)   Acute delirium   Hypokalemia  Discharge Condition: Stable  Diet recommendation: Heart healthy diet discussed in details   History of present illness:  79 y/o female with heavy smoking, etoh use, dementia, hx of hypertrophic CM presented after a mechanical fall at home. Patient given a dose of hydralazine on admission for elevated BP and developed hypotension with rapid A fib and transferred to SDU. During the hospital stay pt developed chest pain in the setting of positive troponins.   Assessment/Plan:  Hypotension  Patient was given a dose of hydralazine for elevated blood pressure on admission and subsequently dropped her blood pressure as low as 70s systolic. She has a history of hypertrophic cardiomyopathy as per cardiology evaluation this morning she may have had a hypotensive episode due to further LV outflow tract obstruction due to afterload reduction with hydralazine. Blood pressure currently stable. Recommend avoiding afterload reducing agents.  A. fib with RVR  Likely triggered by  her hypotensive episode. She has been started on amiodarone bolus followed by a drip. Now converted to NSR. Switched to po amiodarone A 2-D echo shows hyperdynamic LV with EF of 75-80%. Her CHADS- vascular is 4 and started on Xeralto by cardiology. Due to episodes of bradycardia, metoprolol was discontinued and cardiology team recommended discontinuing medication for now. NSTEMI  possibly in the setting of demand ischemia with hypotension and Afib. Denies any chest pain. Stable on tele. Serial CE's obtained and trending down of troponins noted. Fall  Mechanical. Has a right scalp laceration with staples placed in the ED. No active bleeding at this time. Will continue PT in SNF and pt, son both in agreement.  Dementia with delirium  Patient lives with her son and as per him she does have moderate underlying dementia. However she has been very delirious and periodically agitated and combative. Her CIWA scale quite elevated and requiring Ativan with good effect. Patient also drinks on a daily basis (as per son has one drink per day however her alcohol level on presentation was quite elevated. She also smokes 2-3 packs of cigarettes per day. No withdrawal episodes during the hospital stay. Hypokalemia  Replenished  ? Dysphagia  Patient noticed to have difficulty swallowing as per nurse. Speech and swallow evaluation Appreciated. Dr. Windy Canny spoke with ENT Dr. Annalee Genta who recommends to have patient followup in the clinic in about 4 weeks for possible indirect endoscopy and also can evaluate how she does on current dysphagia diet.  Acute Kidney injury  Mild and noted in a.m. labs. Likely prerenal in the setting of poor by mouth intake last few days. Encouraged PO intake and creatinine is back to normal  this AM.  Dementia  Moderate as per son.. Continue with Namenda  Tobacco use  Counseled on smoking cessation .smokes almost over 2 packs per day. Nicotine patch provided in the hospital.   Code Status:  Full code   Consultants:  [facility] cardiology) Procedures:  2-D echo  Antibiotics:  None  Other Imaging Studies: Ct Head Wo Contrast 05/13/2012    No cervical spine fracture.  Fusion C2-3.  Cervical spondylotic changes with various degrees of spinal stenosis and foraminal narrowing.  Transverse ligament hypertrophy.  1.9 cm mass in the left vallecula/piriform sinus region which appears hyperdense and is displacing adjacent air column extending to the level of the vocal cords.  Etiology indeterminate.  It is possible this represents a benign process such as a proteinaceous vallecula cyst however, mucosal abnormality not excluded.    Ct Cervical Spine Wo Contrast 05/13/2012  Left parietal scalp hematoma without underlying fracture or intracranial hemorrhage.    Dg Chest Port 1 View 05/14/2012    Slight cardiac silhouette enlargement.  Slight vascular congestion pattern accentuated by semi-erect positioning.  Hazy infiltrative density in left upper midlung area.  Slight prominence of reticular interstitial markings.  No alveolar pulmonary edema, consolidation, or pleural effusion is evident.   Discharge Exam: Filed Vitals:   05/19/12 0628  BP: 119/80  Pulse: 62  Temp: 98.4 F (36.9 C)  Resp: 18   Filed Vitals:   05/18/12 1427 05/18/12 2143 05/18/12 2300 05/19/12 0628  BP: 135/77 170/77 154/85 119/80  Pulse: 60 60  62  Temp: 98.5 F (36.9 C) 97.5 F (36.4 C)  98.4 F (36.9 C)  TempSrc: Oral Oral  Oral  Resp: 18 16  18   Height:      Weight:      SpO2: 95% 96%  96%    General: Pt is alert, follows commands appropriately, not in acute distress Cardiovascular: Regular rate and rhythm, S1/S2 +, no murmurs, no rubs, no gallops Respiratory: Clear to auscultation bilaterally, no wheezing, no crackles, no rhonchi Abdominal: Soft, non tender, non distended, bowel sounds +, no guarding Extremities: no edema, no cyanosis, pulses palpable bilaterally DP and PT Neuro: Grossly  nonfocal  Discharge Instructions  Discharge Orders   Future Orders Complete By Expires     Diet - low sodium heart healthy  As directed     Increase activity slowly  As directed         Medication List    STOP taking these medications       aspirin 81 MG tablet     TOPROL XL 100 MG 24 hr tablet  Generic drug:  metoprolol succinate      TAKE these medications       alendronate 70 MG tablet  Commonly known as:  FOSAMAX  Take 70 mg by mouth every 7 (seven) days. On Thursdays.   Take with a full glass of water on an empty stomach.     amiodarone 200 MG tablet  Commonly known as:  PACERONE  Take 1 tablet (200 mg total) by mouth 3 (three) times daily.     calcium-vitamin D 500-200 MG-UNIT per tablet  Commonly known as:  OSCAL WITH D  Take 1 tablet by mouth every evening.     donepezil 5 MG tablet  Commonly known as:  ARICEPT  Take 5 mg by mouth at bedtime.     glucosamine-chondroitin 500-400 MG tablet  Take 1 tablet by mouth daily.     multivitamin tablet  Take 1  tablet by mouth daily.     Rivaroxaban 20 MG Tabs  Commonly known as:  XARELTO  Take 1 tablet (20 mg total) by mouth daily with breakfast.           Follow-up Information   Follow up with SHOEMAKER, DAVID, MD. Schedule an appointment as soon as possible for a visit in 4 weeks.   Contact information:   [address], [address] [place] [address] [phone]       Follow up with [name], MD In 2 weeks.   Contact information:   [address] [place] [address] [phone]        The results of significant diagnostics from this hospitalization (including imaging, microbiology, ancillary and laboratory) are listed below for reference.     Microbiology: Recent Results (from the past 240 hour(s))  MRSA PCR SCREENING     Status: None   Collection Time    05/14/12  6:50 AM      Result Value Range Status   MRSA by PCR  NEGATIVE  NEGATIVE Final   Comment:            The GeneXpert MRSA Assay (FDA     approved for NASAL specimens     only), is one component of a     comprehensive MRSA colonization     surveillance program. It is not     intended to diagnose MRSA     infection nor to guide or     monitor treatment for     MRSA infections.     Labs: Basic Metabolic Panel:  Recent Labs Lab 05/14/12 0433 05/15/12 0400 05/17/12 0433 05/18/12 0433 05/19/12 0433  NA 137 132* 130* 131* 129*  K 3.6 3.4* 2.9* 4.3 4.2  CL 97 99 93* 95* 96  CO2 30 23 25 25 23   GLUCOSE 113* 104* 145* 113* 118*  BUN 12 11 23  41* 34*  CREATININE 0.65 0.60 1.10 1.21* 0.92  CALCIUM 9.7 9.0 9.3 9.2 8.9  MG  --   --  1.8  --   --    CBC:  Recent Labs Lab 05/13/12 2010 05/13/12 2030 05/14/12 0433 05/15/12 0400  WBC 6.3  --  8.7 6.8  NEUTROABS 4.3  --   --   --   HGB 15.5* 16.0* 15.8* 14.2  HCT 43.6 47.0* 45.5 40.6  MCV 92.8  --  92.9 92.7  PLT 160  --  169 130*   Cardiac Enzymes:  Recent Labs Lab 05/14/12 1250 05/14/12 1912 05/15/12 2320 05/16/12 0515 05/16/12 1110  TROPONINI 2.55* 4.12* 1.49* 0.85* 0.81*   SIGNED: Time coordinating discharge: Over 30 minutes  Debbora Presto, MD  [facility] 05/19/2012, 11:31 AM Pager [phone]  If 7PM-7AM, please contact night-coverage [email or web address] Password [email or web address]

## 2012-05-31 ENCOUNTER — Encounter: Payer: Self-pay | Admitting: Cardiology

## 2012-05-31 ENCOUNTER — Ambulatory Visit (INDEPENDENT_AMBULATORY_CARE_PROVIDER_SITE_OTHER): Payer: Medicare Other | Admitting: Cardiology

## 2012-05-31 VITALS — BP 154/72 | HR 57 | Ht 65.5 in | Wt 115.6 lb

## 2012-05-31 DIAGNOSIS — S0101XD Laceration without foreign body of scalp, subsequent encounter: Secondary | ICD-10-CM

## 2012-05-31 DIAGNOSIS — F17200 Nicotine dependence, unspecified, uncomplicated: Secondary | ICD-10-CM

## 2012-05-31 DIAGNOSIS — Z7189 Other specified counseling: Secondary | ICD-10-CM

## 2012-05-31 DIAGNOSIS — I119 Hypertensive heart disease without heart failure: Secondary | ICD-10-CM

## 2012-05-31 DIAGNOSIS — I4891 Unspecified atrial fibrillation: Secondary | ICD-10-CM

## 2012-05-31 DIAGNOSIS — E876 Hypokalemia: Secondary | ICD-10-CM

## 2012-05-31 DIAGNOSIS — I214 Non-ST elevation (NSTEMI) myocardial infarction: Secondary | ICD-10-CM

## 2012-05-31 DIAGNOSIS — Z716 Tobacco abuse counseling: Secondary | ICD-10-CM

## 2012-05-31 MED ORDER — AMIODARONE HCL 200 MG PO TABS: 200.0000 mg | ORAL_TABLET | Freq: Two times a day (BID) | ORAL | Status: DC

## 2012-05-31 NOTE — Assessment & Plan Note (Addendum)
Blood pressure is currently stable off beta blocker and on amiodarone.  The patient denies any dizzy spells or recurrent syncope.  She is avoiding dietary salt.

## 2012-05-31 NOTE — Assessment & Plan Note (Signed)
The patient has not been having any recurrent atrial fibrillation since being placed on amiodarone.

## 2012-05-31 NOTE — Assessment & Plan Note (Signed)
Patient still has her sutures in the scalp wound.  She will plan to have those removed before she is discharged from the nursing home.  The wound appears to be healing well.  She has not been having any headaches or episodes of disorientation or confusion

## 2012-05-31 NOTE — Assessment & Plan Note (Signed)
The patient is wearing a patch.  She has not smoking at the present time.  I urged her not to restart smoking when she gets home from the nursing home.

## 2012-05-31 NOTE — Assessment & Plan Note (Signed)
In the hospital she had elevation of her troponins felt to be secondary to rate-related ischemia in the face of her IHSS.  She did not require invasive study.  She has not been experiencing any chest pain or angina.

## 2012-05-31 NOTE — Progress Notes (Signed)
Tricia [name] Date of Birth:  1925/05/22 [facility] [address] [place], [place]  [address] [phone]         Fax   [phone]  History of Present Illness: This pleasant 79 year old woman is seen for a post hospital office visit.  She was recently hospitalized at [facility] long hospital from 05/13/12 until 05/19/12.  He was admitted after a mechanical fall resulting in a significant laceration of her scalp requiring sutures.  At the hospital she was found to be in atrial fibrillation with a rapid ventricular response.  She had problems with low blood pressure.  She had had a previous history of hypertensive heart disease without heart failure and she does have known IHSS.  In the hospital she had problems with low blood pressure and bradycardia and she was taken off her previous beta blocker and placed on amiodarone 200 mg 3 times a day.  On amiodarone she converted to normal sinus rhythm.  The patient was also started on Xarelto in the hospital because of her atrial fibrillation and her IHSS.  At the present time she is in good living skilled nursing facility at [facility].  She anticipates being discharged soon to return home where her son lives and will help to care for her.  The patient is a recent widow and lost her husband in January 2014.  Current Outpatient Prescriptions  Medication Sig Dispense Refill  . alendronate (FOSAMAX) 70 MG tablet Take 70 mg by mouth every 7 (seven) days. On Thursdays.  Take with a full glass of water on an empty stomach.      Marland Kitchen amiodarone (PACERONE) 200 MG tablet Take 1 tablet (200 mg total) by mouth 2 (two) times daily.  60 tablet  5  . calcium-vitamin D (OSCAL WITH D) 500-200 MG-UNIT per tablet Take 1 tablet by mouth every evening.      . donepezil (ARICEPT) 5 MG tablet Take 5 mg by mouth at bedtime.       Marland Kitchen glucosamine-chondroitin 500-400 MG tablet Take 1 tablet by mouth daily.       . Multiple Vitamin (MULTIVITAMIN) tablet Take 1  tablet by mouth daily.        . Rivaroxaban (XARELTO) 20 MG TABS Take 1 tablet (20 mg total) by mouth daily with breakfast.  30 tablet  3   No current facility-administered medications for this visit.    No Known Allergies  Patient Active Problem List  Diagnosis  . IHSS (idiopathic hypertrophic subaortic stenosis)  . Tobacco abuse counseling  . Benign hypertensive heart disease without heart failure  . Cancer of upper-outer quadrant of female breast  . Near syncope  . Scalp laceration  . Fall at home  . Rapid atrial fibrillation  . Hypotension, unspecified  . NSTEMI (non-ST elevated myocardial infarction)  . Acute delirium  . Hypokalemia    History  Smoking status  . Current Every Day Smoker -- 1.00 packs/day for 20 years  . Types: Cigarettes  Smokeless tobacco  . Never Used    History  Alcohol Use  . Yes    Comment: 1.5 oz of rum in afternoon    Family History  Problem Relation Age of Onset  . Heart attack Mother   . Breast cancer Mother   . Cancer Mother     breast  . Diabetes Father   . Stomach cancer Brother   . Cancer Brother     colon    Review of Systems: Constitutional: no  fever chills diaphoresis or fatigue or change in weight.  Head and neck: no hearing loss, no epistaxis, no photophobia or visual disturbance. Respiratory: No cough, shortness of breath or wheezing. Cardiovascular: No chest pain peripheral edema, palpitations. Gastrointestinal: No abdominal distention, no abdominal pain, no change in bowel habits hematochezia or melena. Genitourinary: No dysuria, no frequency, no urgency, no nocturia. Musculoskeletal:No arthralgias, no back pain, no gait disturbance or myalgias. Neurological: No dizziness, no headaches, no numbness, no seizures, no syncope, no weakness, no tremors. Hematologic: No lymphadenopathy, no easy bruising. Psychiatric: No confusion, no hallucinations, no sleep disturbance.    Physical Exam: Filed Vitals:   05/31/12  1435  BP: 154/72  Pulse: 57   the general appearance reveals a well-developed well-nourished woman in no distress.  The scalp incision wound appears to be healing well.  Metal sutures are still in place.The head and neck exam reveals pupils equal and reactive.  Extraocular movements are full.  There is no scleral icterus.  The mouth and pharynx are normal.  The neck is supple.  The carotids reveal no bruits.  The jugular venous pressure is normal.  The  thyroid is not enlarged.  There is no lymphadenopathy.  The chest is clear to percussion and auscultation.  There are no rales or rhonchi.  Expansion of the chest is symmetrical.  The precordium is quiet.  The rhythm is regular  The first heart sound is normal.  The second heart sound is physiologically split.  There is grade 2/6 harsh systolic ejection murmur at the base.  There is no abnormal lift or heave.  The abdomen is soft and nontender.  The bowel sounds are normal.  The liver and spleen are not enlarged.  There are no abdominal masses.  There are no abdominal bruits.  Extremities reveal good pedal pulses.  There is no phlebitis or edema.  There is no cyanosis or clubbing.  Strength is normal and symmetrical in all extremities.  There is no lateralizing weakness.  There are no sensory deficits.  The skin is warm and dry.  There is no rash.  EKG shows sinus bradycardia with first degree AV block and minimal voltage for LVH and prolonged QTC at 517 ms   Assessment / Plan: We will decrease her amiodarone down to 200 mg twice a day.  Continue other medicines the same.  We're checking a CBC and a basal metabolic panel today.  She will be rechecked for a followup office visit and EKG in 2 months.

## 2012-05-31 NOTE — Assessment & Plan Note (Signed)
The patient had hypokalemia in the hospital.  We are checking a basal metabolic panel today

## 2012-05-31 NOTE — Patient Instructions (Signed)
Your physician recommends that you schedule a follow-up appointment in: 2 month  Your physician has recommended you make the following change in your medication:   Decrease amiodarone to 200 mg twice daily 12 hours apart   Your physician recommends that you return for lab work in: today bmet cbc down stairs with lab core

## 2012-06-14 ENCOUNTER — Telehealth: Payer: Self-pay | Admitting: *Deleted

## 2012-06-14 NOTE — Telephone Encounter (Signed)
Called [phone] and obtained prior [place] - [place] [phone]. Left message at [facility]

## 2012-07-13 ENCOUNTER — Other Ambulatory Visit: Payer: Self-pay | Admitting: *Deleted

## 2012-07-13 DIAGNOSIS — C50919 Malignant neoplasm of unspecified site of unspecified female breast: Secondary | ICD-10-CM

## 2012-07-13 MED ORDER — LETROZOLE 2.5 MG PO TABS: 2.5000 mg | ORAL_TABLET | Freq: Every day | ORAL | Status: AC

## 2012-07-14 ENCOUNTER — Telehealth: Payer: Self-pay | Admitting: *Deleted

## 2012-07-14 ENCOUNTER — Encounter: Payer: Self-pay | Admitting: Oncology

## 2012-07-14 NOTE — Telephone Encounter (Signed)
Received request from Melissa to call pt w/ an appt in June/July.  Confirmed 09/13/12 appt w/ pt.  Mailed letter & calendar to pt.

## 2012-08-02 ENCOUNTER — Encounter: Payer: Self-pay | Admitting: Cardiology

## 2012-08-02 ENCOUNTER — Ambulatory Visit (INDEPENDENT_AMBULATORY_CARE_PROVIDER_SITE_OTHER): Payer: Medicare Other | Admitting: Cardiology

## 2012-08-02 VITALS — BP 126/70 | HR 77 | Ht 65.5 in | Wt 114.8 lb

## 2012-08-02 DIAGNOSIS — Z716 Tobacco abuse counseling: Secondary | ICD-10-CM

## 2012-08-02 DIAGNOSIS — I421 Obstructive hypertrophic cardiomyopathy: Secondary | ICD-10-CM

## 2012-08-02 DIAGNOSIS — F17200 Nicotine dependence, unspecified, uncomplicated: Secondary | ICD-10-CM

## 2012-08-02 DIAGNOSIS — Z7189 Other specified counseling: Secondary | ICD-10-CM

## 2012-08-02 DIAGNOSIS — I4891 Unspecified atrial fibrillation: Secondary | ICD-10-CM

## 2012-08-02 MED ORDER — METOPROLOL SUCCINATE ER 50 MG PO TB24: 50.0000 mg | ORAL_TABLET | Freq: Every day | ORAL | Status: AC

## 2012-08-02 NOTE — Patient Instructions (Signed)
DECREASE YOUR AMIODARONE TO 200 MG ONCE A DAY  START TOPROL (METOPROLOL) 50 MG DAILY  Your physician recommends that you schedule a follow-up appointment in: 3 month ov/ekg

## 2012-08-02 NOTE — Assessment & Plan Note (Signed)
The patient continues to smoke cigarettes moderately and also she drinks moderate alcohol.  We have specifically asked her to work harder on stopping smoking.

## 2012-08-02 NOTE — Progress Notes (Signed)
Tricia [name] Date of Birth:  11-Jul-1925 [name] [address] [place], [place]  [address] [phone]         Fax   [phone]  History of Present Illness: This pleasant 79 year old woman is seen for a scheduled followup office visit. She was recently hospitalized at [facility] long hospital from 05/13/12 until 05/19/12. He was admitted after a mechanical fall resulting in a significant laceration of her scalp requiring sutures. At the hospital she was found to be in atrial fibrillation with a rapid ventricular response. She had problems with low blood pressure. She had had a previous history of hypertensive heart disease without heart failure and she does have known IHSS. In the hospital she had problems with low blood pressure and bradycardia and she was taken off her previous beta blocker and placed on amiodarone 200 mg 3 times a day. On amiodarone she converted to normal sinus rhythm. The patient was also started on Xarelto in the hospital because of her atrial fibrillation and her IHSS.  The patient is a recent widow and lost her husband in January 2014.  She now lives with her son.   Current Outpatient Prescriptions  Medication Sig Dispense Refill  . alendronate (FOSAMAX) 70 MG tablet Take 70 mg by mouth every 7 (seven) days. On Thursdays.  Take with a full glass of water on an empty stomach.      Marland Kitchen amiodarone (PACERONE) 200 MG tablet Take 200 mg by mouth daily.      . calcium-vitamin D (OSCAL WITH D) 500-200 MG-UNIT per tablet Take 1 tablet by mouth every evening.      . donepezil (ARICEPT) 5 MG tablet Take 5 mg by mouth at bedtime.       Marland Kitchen glucosamine-chondroitin 500-400 MG tablet Take 1 tablet by mouth daily.       Marland Kitchen letrozole (FEMARA) 2.5 MG tablet Take 1 tablet (2.5 mg total) by mouth daily. Please make follow up appointment  90 tablet  0  . Multiple Vitamin (MULTIVITAMIN) tablet Take 1 tablet by mouth daily.        . Rivaroxaban (XARELTO) 20 MG TABS Take 1  tablet (20 mg total) by mouth daily with breakfast.  30 tablet  3  . metoprolol succinate (TOPROL-XL) 50 MG 24 hr tablet Take 1 tablet (50 mg total) by mouth daily. Take with or immediately following a meal.  30 tablet  5   No current facility-administered medications for this visit.    No Known Allergies  Patient Active Problem List   Diagnosis Date Noted  . Hypokalemia 05/17/2012  . Acute delirium 05/16/2012  . NSTEMI (non-ST elevated myocardial infarction) 05/15/2012  . Fall at home 05/14/2012  . Rapid atrial fibrillation 05/14/2012  . Hypotension, unspecified 05/14/2012  . Near syncope 05/13/2012  . Scalp laceration 05/13/2012  . Cancer of upper-outer quadrant of female breast 05/14/2011  . IHSS (idiopathic hypertrophic subaortic stenosis) 11/01/2010  . Tobacco abuse counseling 11/01/2010  . Benign hypertensive heart disease without heart failure 11/01/2010    History  Smoking status  . Current Every Day Smoker -- 1.00 packs/day for 20 years  . Types: Cigarettes  Smokeless tobacco  . Never Used    History  Alcohol Use  . Yes    Comment: 1.5 oz of rum in afternoon    Family History  Problem Relation Age of Onset  . Heart attack Mother   . Breast cancer Mother   . Cancer Mother  breast  . Diabetes Father   . Stomach cancer Brother   . Cancer Brother     colon    Review of Systems: Constitutional: no fever chills diaphoresis or fatigue or change in weight.  Head and neck: no hearing loss, no epistaxis, no photophobia or visual disturbance. Respiratory: No cough, shortness of breath or wheezing. Cardiovascular: No chest pain peripheral edema, palpitations. Gastrointestinal: No abdominal distention, no abdominal pain, no change in bowel habits hematochezia or melena. Genitourinary: No dysuria, no frequency, no urgency, no nocturia. Musculoskeletal:No arthralgias, no back pain, no gait disturbance or myalgias. Neurological: No dizziness, no headaches, no  numbness, no seizures, no syncope, no weakness, no tremors. Hematologic: No lymphadenopathy, no easy bruising. Psychiatric: No confusion, no hallucinations, no sleep disturbance.    Physical Exam: Filed Vitals:   08/02/12 1408  BP: 126/70  Pulse: 77   the general appearance reveals a thin elderly woman in no acute distress.  She has a nonproductive cough from smoking.The head and neck exam reveals pupils equal and reactive.  Extraocular movements are full.  There is no scleral icterus.  The mouth and pharynx are normal.  The neck is supple.  The carotids reveal no bruits.  The jugular venous pressure is normal.  The  thyroid is not enlarged.  There is no lymphadenopathy.  The chest is clear to percussion and auscultation.  There are no rales or rhonchi.  Expansion of the chest is symmetrical.  The precordium is quiet.  The first heart sound is normal.  The second heart sound is physiologically split.  There is a harsh grade 3/6 systolic ejection murmur at the apex and base consistent with her IHSS.  There is a prominent left ventricular lift and hyperdynamic LV impulse at the apex.  The abdomen is soft and nontender.  The bowel sounds are normal.  The liver and spleen are not enlarged.  There are no abdominal masses.  There are no abdominal bruits.  Extremities reveal good pedal pulses.  There is no phlebitis or edema.  There is no cyanosis or clubbing.  Strength is normal and symmetrical in all extremities.  There is no lateralizing weakness.  There are no sensory deficits.  The skin is warm and dry.  There is no rash.  EKG shows normal sinus rhythm with left axis deviation and LVH   Assessment / Plan: Continue same medication except decrease amiodarone to 200 mg once a day and resume beta blocker in the form of Toprol XL 50 mg one daily. Recheck in 3 months for followup office visit and EKG.  Urged her to practice moderation in her alcohol intake and urged her to quit smoking.

## 2012-08-02 NOTE — Assessment & Plan Note (Signed)
The patient denies any chest pain.  She has not had any syncopal episodes since discharge from the hospital.

## 2012-08-02 NOTE — Assessment & Plan Note (Signed)
She has not been aware of any tachycardia or palpitations since last visit

## 2012-08-17 ENCOUNTER — Ambulatory Visit (INDEPENDENT_AMBULATORY_CARE_PROVIDER_SITE_OTHER): Payer: Medicare Other | Admitting: General Surgery

## 2012-08-29 DIAGNOSIS — S7291XA Unspecified fracture of right femur, initial encounter for closed fracture: Secondary | ICD-10-CM

## 2012-08-29 HISTORY — DX: Unspecified fracture of right femur, initial encounter for closed fracture: S72.91XA

## 2012-09-13 ENCOUNTER — Ambulatory Visit: Payer: Medicare Other | Admitting: Family

## 2012-09-21 ENCOUNTER — Encounter (HOSPITAL_COMMUNITY): Payer: Self-pay | Admitting: Emergency Medicine

## 2012-09-21 ENCOUNTER — Emergency Department (HOSPITAL_COMMUNITY): Payer: Medicare Other

## 2012-09-21 ENCOUNTER — Inpatient Hospital Stay (HOSPITAL_COMMUNITY)
Admission: EM | Admit: 2012-09-21 | Discharge: 2012-09-24 | DRG: 481 | Disposition: A | Discharge status: Skilled Nursing Facility | Payer: Medicare Other | Attending: Internal Medicine | Admitting: Internal Medicine

## 2012-09-21 DIAGNOSIS — I119 Hypertensive heart disease without heart failure: Secondary | ICD-10-CM | POA: Diagnosis present

## 2012-09-21 DIAGNOSIS — Z87891 Personal history of nicotine dependence: Secondary | ICD-10-CM

## 2012-09-21 DIAGNOSIS — W19XXXA Unspecified fall, initial encounter: Secondary | ICD-10-CM

## 2012-09-21 DIAGNOSIS — I480 Paroxysmal atrial fibrillation: Secondary | ICD-10-CM

## 2012-09-21 DIAGNOSIS — S72309A Unspecified fracture of shaft of unspecified femur, initial encounter for closed fracture: Principal | ICD-10-CM | POA: Diagnosis present

## 2012-09-21 DIAGNOSIS — S7291XA Unspecified fracture of right femur, initial encounter for closed fracture: Secondary | ICD-10-CM

## 2012-09-21 DIAGNOSIS — S0101XD Laceration without foreign body of scalp, subsequent encounter: Secondary | ICD-10-CM

## 2012-09-21 DIAGNOSIS — Z781 Physical restraint status: Secondary | ICD-10-CM | POA: Diagnosis not present

## 2012-09-21 DIAGNOSIS — I4891 Unspecified atrial fibrillation: Secondary | ICD-10-CM | POA: Diagnosis present

## 2012-09-21 DIAGNOSIS — Z853 Personal history of malignant neoplasm of breast: Secondary | ICD-10-CM

## 2012-09-21 DIAGNOSIS — R55 Syncope and collapse: Secondary | ICD-10-CM

## 2012-09-21 DIAGNOSIS — E876 Hypokalemia: Secondary | ICD-10-CM

## 2012-09-21 DIAGNOSIS — I214 Non-ST elevation (NSTEMI) myocardial infarction: Secondary | ICD-10-CM

## 2012-09-21 DIAGNOSIS — Z716 Tobacco abuse counseling: Secondary | ICD-10-CM

## 2012-09-21 DIAGNOSIS — R410 Disorientation, unspecified: Secondary | ICD-10-CM

## 2012-09-21 DIAGNOSIS — Y92009 Unspecified place in unspecified non-institutional (private) residence as the place of occurrence of the external cause: Secondary | ICD-10-CM

## 2012-09-21 DIAGNOSIS — Z01810 Encounter for preprocedural cardiovascular examination: Secondary | ICD-10-CM

## 2012-09-21 DIAGNOSIS — I959 Hypotension, unspecified: Secondary | ICD-10-CM

## 2012-09-21 DIAGNOSIS — F05 Delirium due to known physiological condition: Secondary | ICD-10-CM | POA: Diagnosis not present

## 2012-09-21 DIAGNOSIS — I421 Obstructive hypertrophic cardiomyopathy: Secondary | ICD-10-CM | POA: Diagnosis present

## 2012-09-21 DIAGNOSIS — S7290XA Unspecified fracture of unspecified femur, initial encounter for closed fracture: Secondary | ICD-10-CM

## 2012-09-21 DIAGNOSIS — D649 Anemia, unspecified: Secondary | ICD-10-CM | POA: Diagnosis present

## 2012-09-21 DIAGNOSIS — IMO0002 Reserved for concepts with insufficient information to code with codable children: Secondary | ICD-10-CM

## 2012-09-21 DIAGNOSIS — W010XXA Fall on same level from slipping, tripping and stumbling without subsequent striking against object, initial encounter: Secondary | ICD-10-CM | POA: Diagnosis present

## 2012-09-21 DIAGNOSIS — S72109A Unspecified trochanteric fracture of unspecified femur, initial encounter for closed fracture: Secondary | ICD-10-CM | POA: Diagnosis present

## 2012-09-21 DIAGNOSIS — C50419 Malignant neoplasm of upper-outer quadrant of unspecified female breast: Secondary | ICD-10-CM

## 2012-09-21 HISTORY — DX: Unspecified atrial fibrillation: I48.91

## 2012-09-21 LAB — CBC WITH DIFFERENTIAL/PLATELET
Basophils Relative: 0 % (ref 0–1)
Eosinophils Absolute: 0 10*3/uL (ref 0.0–0.7)
Eosinophils Relative: 0 % (ref 0–5)
Lymphs Abs: 0.8 10*3/uL (ref 0.7–4.0)
MCH: 32.3 pg (ref 26.0–34.0)
MCHC: 35.7 g/dL (ref 30.0–36.0)
MCV: 90.6 fL (ref 78.0–100.0)
Monocytes Relative: 5 % (ref 3–12)
Neutrophils Relative %: 87 % — ABNORMAL HIGH (ref 43–77)
Platelets: 174 10*3/uL (ref 150–400)
RBC: 3.93 MIL/uL (ref 3.87–5.11)

## 2012-09-21 LAB — URINALYSIS, ROUTINE W REFLEX MICROSCOPIC
Leukocytes, UA: NEGATIVE
Nitrite: NEGATIVE
Protein, ur: NEGATIVE mg/dL
Specific Gravity, Urine: 1.016 (ref 1.005–1.030)
Urobilinogen, UA: 0.2 mg/dL (ref 0.0–1.0)

## 2012-09-21 LAB — COMPREHENSIVE METABOLIC PANEL
Albumin: 3.4 g/dL — ABNORMAL LOW (ref 3.5–5.2)
BUN: 21 mg/dL (ref 6–23)
Calcium: 8.8 mg/dL (ref 8.4–10.5)
GFR calc Af Amer: 68 mL/min — ABNORMAL LOW (ref >90)
Glucose, Bld: 168 mg/dL — ABNORMAL HIGH (ref 70–99)
Sodium: 135 mEq/L (ref 135–145)
Total Protein: 6.4 g/dL (ref 6.0–8.3)

## 2012-09-21 LAB — URINE MICROSCOPIC-ADD ON

## 2012-09-21 LAB — PROTIME-INR: Prothrombin Time: 12.9 seconds (ref 11.6–15.2)

## 2012-09-21 MED ORDER — SODIUM CHLORIDE 0.9 % IV SOLN
INTRAVENOUS | Status: AC
  Administered 2012-09-21 – 2012-09-22 (×2): via INTRAVENOUS

## 2012-09-21 MED ORDER — HYDRALAZINE HCL 20 MG/ML IJ SOLN
10.0000 mg | Freq: Four times a day (QID) | INTRAMUSCULAR | Status: DC | PRN
  Administered 2012-09-23: 10 mg via INTRAVENOUS
  Filled 2012-09-21: qty 1

## 2012-09-21 MED ORDER — METOPROLOL SUCCINATE ER 50 MG PO TB24
50.0000 mg | ORAL_TABLET | Freq: Every day | ORAL | Status: DC
  Administered 2012-09-22 – 2012-09-24 (×3): 50 mg via ORAL
  Filled 2012-09-21 (×3): qty 1

## 2012-09-21 MED ORDER — MORPHINE SULFATE 2 MG/ML IJ SOLN
0.5000 mg | INTRAMUSCULAR | Status: DC | PRN
  Administered 2012-09-22 (×3): 0.5 mg via INTRAVENOUS
  Filled 2012-09-21 (×3): qty 1

## 2012-09-21 MED ORDER — HYDROCODONE-ACETAMINOPHEN 5-325 MG PO TABS
1.0000 | ORAL_TABLET | Freq: Four times a day (QID) | ORAL | Status: DC | PRN
  Administered 2012-09-23 – 2012-09-24 (×2): 1 via ORAL
  Filled 2012-09-21 (×2): qty 1

## 2012-09-21 MED ORDER — FENTANYL CITRATE 0.05 MG/ML IJ SOLN
INTRAMUSCULAR | Status: AC
  Administered 2012-09-21: 50 ug
  Filled 2012-09-21: qty 2

## 2012-09-21 MED ORDER — DOCUSATE SODIUM 100 MG PO CAPS
100.0000 mg | ORAL_CAPSULE | Freq: Two times a day (BID) | ORAL | Status: DC
Start: 2012-09-21 — End: 2012-09-24
  Administered 2012-09-22 – 2012-09-24 (×5): 100 mg via ORAL
  Filled 2012-09-21 (×7): qty 1

## 2012-09-21 MED ORDER — MORPHINE SULFATE 4 MG/ML IJ SOLN
4.0000 mg | INTRAMUSCULAR | Status: AC | PRN
  Administered 2012-09-21 (×3): 4 mg via INTRAVENOUS
  Filled 2012-09-21 (×3): qty 1

## 2012-09-21 NOTE — H&P (Signed)
PCP:   [name], MD   Chief Complaint:  Fall hurt right leg  HPI: 79 yo female lives at home independently had a mechanical fall at home today without loc and hurt her left leg.  No recent illnessess.  Her right upper thigh is very painful which has been relieved some with iv narcotics in the ED.  Her son lives with her at her house.  Denies any n/v.  No other active medical issues.  Review of Systems:  Positive and negative as per HPI otherwise all other systems are negative  Past Medical History: Past Medical History  Diagnosis Date  . Idiopathic hypertrophic subaortic stenosis   . Irregular heart beat   . Fibrocystic breast disease   . Tobacco abuse   . Hypertension   . History of colonoscopy   . Blood transfusion 10 yrs ago    after bleeding ulcer in stomach  . Heart murmur   . Cancer     left breast   Past Surgical History  Procedure Laterality Date  . US echocardiography  12/05/2008    EF 60-65%  . US echocardiography  12/01/2007    EF 60-65%  . US echocardiography  07/01/2006    EF 55-60%  . US echocardiography  06/26/2005    EF 55-60%  . Cardiac catheterization  01/30/2002    EF 75-80%  . Appendectomy  1946  . Right breast benign lump removed  yrs ago  . Breast surgery  10/21/11    simple mastectomy    Medications: Prior to Admission medications   Medication Sig Start Date End Date Taking? Authorizing Provider  calcium-vitamin D (OSCAL WITH D) 500-200 MG-UNIT per tablet Take 1 tablet by mouth every evening.   Yes Historical Provider, MD  donepezil (ARICEPT) 5 MG tablet Take 5 mg by mouth at bedtime.  08/30/11  Yes Historical Provider, MD  glucosamine-chondroitin 500-400 MG tablet Take 1 tablet by mouth daily.    Yes Historical Provider, MD  letrozole (FEMARA) 2.5 MG tablet Take 1 tablet (2.5 mg total) by mouth daily. Please make follow up appointment 07/13/12  Yes Lowella Dell, MD  metoprolol succinate (TOPROL-XL) 50 MG 24 hr tablet Take 1 tablet (50 mg  total) by mouth daily. Take with or immediately following a meal. 08/02/12  Yes [name], MD  Multiple Vitamin (MULTIVITAMIN WITH MINERALS) TABS Take 1 tablet by mouth daily.   Yes Historical Provider, MD    Allergies:  No Known Allergies  Social History:  reports that she has been smoking Cigarettes.  She has a 20 pack-year smoking history. She has never used smokeless tobacco. She reports that  drinks alcohol. She reports that she does not use illicit drugs.  Family History: Family History  Problem Relation Age of Onset  . Heart attack Mother   . Breast cancer Mother   . Cancer Mother     breast  . Diabetes Father   . Stomach cancer Brother   . Cancer Brother     colon    Physical Exam: Filed Vitals:   09/21/12 1929 09/21/12 2100 09/21/12 2130  BP: 215/94 151/89 134/87  Pulse: 67 85 85  Temp: 97.9 F (36.6 C)    TempSrc: Oral    Resp: 12    SpO2: 98% 96% 92%   General appearance: alert, cooperative and no distress Head: Normocephalic, without obvious abnormality, atraumatic Eyes: negative Nose: Nares normal. Septum midline. Mucosa normal. No drainage or sinus tenderness. Neck: no JVD and supple,  symmetrical, trachea midline Lungs: clear to auscultation bilaterally Heart: regular rate and rhythm, S1, S2 normal, no murmur, click, rub or gallop Abdomen: soft, non-tender; bowel sounds normal; no masses,  no organomegaly Extremities: extremities normal, atraumatic, no cyanosis or edema  Right le painful with any miminal movement Pulses: 2+ and symmetric Skin: Skin color, texture, turgor normal. No rashes or lesions Neurologic: Grossly normal  Labs on Admission:   Recent Labs  09/21/12 2105  NA 135  K 3.5  CL 97  CO2 25  GLUCOSE 168*  BUN 21  CREATININE 0.88  CALCIUM 8.8    Recent Labs  09/21/12 2105  AST 17  ALT 11  ALKPHOS 47  BILITOT 0.2*  PROT 6.4  ALBUMIN 3.4*    Recent Labs  09/21/12 2105  WBC 10.8*  NEUTROABS 9.4*  HGB 12.7  HCT  35.6*  MCV 90.6  PLT 174   Radiological Exams on Admission: Dg Hip Complete Right  09/21/2012   *RADIOLOGY REPORT*  Clinical Data: Right hip pain secondary to a fall.  RIGHT HIP - COMPLETE 2+ VIEW  Comparison: None.  Findings: There is a comminuted spiral fracture of the proximal femoral shaft extending 25 cm distally.  The fracture does include avulsion of the lesser trochanter.  Fracture does not appear to extend into the greater trochanter.  IMPRESSION: Spiral fracture of the proximal femoral shaft with angulation and displacement.   Original Report Authenticated By: Francene Boyers, M.D.   Dg Femur Right  09/21/2012   *RADIOLOGY REPORT*  Clinical Data: Right femur pain secondary to a fall.  RIGHT FEMUR - 2 VIEW  Comparison: None.  Findings: There is an angulated displaced comminuted spiral fracture of the proximal right femoral shaft extending 25 cm distal to the femoral neck.  The lesser trochanter is avulsed.  There is diffuse osteopenia.  Distal femur is intact.  IMPRESSION: Fracture of the proximal right femoral shaft.   Original Report Authenticated By: Francene Boyers, M.D.    Assessment/Plan 79 yo female with right femur fracture from mechanical fall  Principal Problem:   Femur fracture, right Active Problems:   IHSS (idiopathic hypertrophic subaortic stenosis)   Benign hypertensive heart disease without heart failure   Fall at home  Place on tele.  Ortho has already been notified by ED midlevel and will see in am.  Obtain preop ekg.  Iv morphine prn.  Full code.    Glinda Natzke A 09/21/2012, 9:56 PM

## 2012-09-21 NOTE — ED Notes (Signed)
Report called to 5th floor

## 2012-09-21 NOTE — ED Notes (Signed)
Patient walking out of restaurant and tripped on a rug fell on her buttocks, then fell back and hit head.  Patient c/o pain in upper right femur.  No bruising, swelling, or deformity.  Patient has son who lives with her and was with her during fall.  Alert and oriented.

## 2012-09-21 NOTE — ED Notes (Signed)
[id number]<AV> Expected date:<BR> Expected time:<BR> Means of arrival:<BR> Comments:<BR> fall

## 2012-09-21 NOTE — ED Provider Notes (Signed)
History    CSN: [id number] Arrival date & time 09/21/12  1933  First MD Initiated Contact with Patient 09/21/12 1952     Chief Complaint  Patient presents with  . Fall   (Consider location/radiation/quality/duration/timing/severity/associated sxs/prior Treatment) HPI Comments: Patient was preparing to leave the home.  When she got her foot caught in a falling to the ground, landing on her right hip, EMS was called and patient transported to to the emergency department, shows external rotation of her right leg.  Positive distal pulses.  No previous injury to this leg.  Denies any other injury.  Did not hit her head.  She is alert and appropriate  Patient is a 79 y.o. female presenting with fall. The history is provided by the patient.  Fall This is a new problem. The current episode started today. The problem occurs constantly. Pertinent negatives include no fever, nausea, vomiting or weakness. The symptoms are aggravated by exertion. She has tried nothing for the symptoms. The treatment provided no relief.   Past Medical History  Diagnosis Date  . Idiopathic hypertrophic subaortic stenosis   . Irregular heart beat   . Fibrocystic breast disease   . Tobacco abuse   . Hypertension   . History of colonoscopy   . Blood transfusion 10 yrs ago    after bleeding ulcer in stomach  . Heart murmur   . Cancer     left breast   Past Surgical History  Procedure Laterality Date  . US echocardiography  12/05/2008    EF 60-65%  . US echocardiography  12/01/2007    EF 60-65%  . US echocardiography  07/01/2006    EF 55-60%  . US echocardiography  06/26/2005    EF 55-60%  . Cardiac catheterization  01/30/2002    EF 75-80%  . Appendectomy  1946  . Right breast benign lump removed  yrs ago  . Breast surgery  10/21/11    simple mastectomy   Family History  Problem Relation Age of Onset  . Heart attack Mother   . Breast cancer Mother   . Cancer Mother     breast  . Diabetes Father   .  Stomach cancer Brother   . Cancer Brother     colon   History  Substance Use Topics  . Smoking status: Current Every Day Smoker -- 1.00 packs/day for 20 years    Types: Cigarettes  . Smokeless tobacco: Never Used  . Alcohol Use: Yes     Comment: 1.5 oz of rum in afternoon   OB History   Grav Para Term Preterm Abortions TAB SAB Ect Mult Living                 Review of Systems  Constitutional: Negative for fever.  Cardiovascular: Negative for leg swelling.  Gastrointestinal: Negative for nausea and vomiting.  Musculoskeletal: Positive for gait problem.  Neurological: Negative for dizziness and weakness.  All other systems reviewed and are negative.    Allergies  Review of patient's allergies indicates no known allergies.  Home Medications   Current Outpatient Rx  Name  Route  Sig  Dispense  Refill  . calcium-vitamin D (OSCAL WITH D) 500-200 MG-UNIT per tablet   Oral   Take 1 tablet by mouth every evening.         . donepezil (ARICEPT) 5 MG tablet   Oral   Take 5 mg by mouth at bedtime.          Marland Kitchen  glucosamine-chondroitin 500-400 MG tablet   Oral   Take 1 tablet by mouth daily.          Marland Kitchen letrozole (FEMARA) 2.5 MG tablet   Oral   Take 1 tablet (2.5 mg total) by mouth daily. Please make follow up appointment   90 tablet   0     Approved per "on call" MD   . metoprolol succinate (TOPROL-XL) 50 MG 24 hr tablet   Oral   Take 1 tablet (50 mg total) by mouth daily. Take with or immediately following a meal.   30 tablet   5   . Multiple Vitamin (MULTIVITAMIN WITH MINERALS) TABS   Oral   Take 1 tablet by mouth daily.          BP 134/87  Pulse 85  Temp(Src) 97.9 F (36.6 C) (Oral)  Resp 12  SpO2 92% Physical Exam  Nursing note and vitals reviewed. Constitutional: She is oriented to person, place, and time. She appears well-developed and well-nourished.  HENT:  Head: Normocephalic.  Neck: Normal range of motion.  Cardiovascular: Normal rate and  regular rhythm.   Pulmonary/Chest: Effort normal.  Abdominal: Soft. She exhibits no distension.  Musculoskeletal: She exhibits no edema.       Right hip: She exhibits decreased range of motion, tenderness, bony tenderness and deformity. She exhibits no swelling.       Legs: Neurological: She is alert and oriented to person, place, and time.  Skin: Skin is warm.    ED Course  Procedures (including critical care time) Labs Reviewed  CBC WITH DIFFERENTIAL - Abnormal; Notable for the following:    WBC 10.8 (*)    HCT 35.6 (*)    Neutrophils Relative % 87 (*)    Neutro Abs 9.4 (*)    Lymphocytes Relative 8 (*)    All other components within normal limits  COMPREHENSIVE METABOLIC PANEL - Abnormal; Notable for the following:    Glucose, Bld 168 (*)    Albumin 3.4 (*)    Total Bilirubin 0.2 (*)    GFR calc non Af Amer 59 (*)    GFR calc Af Amer 68 (*)    All other components within normal limits  URINALYSIS, ROUTINE W REFLEX MICROSCOPIC - Abnormal; Notable for the following:    Glucose, UA 100 (*)    Hgb urine dipstick TRACE (*)    All other components within normal limits  PROTIME-INR  URINE MICROSCOPIC-ADD ON   Dg Hip Complete Right  09/21/2012   *RADIOLOGY REPORT*  Clinical Data: Right hip pain secondary to a fall.  RIGHT HIP - COMPLETE 2+ VIEW  Comparison: None.  Findings: There is a comminuted spiral fracture of the proximal femoral shaft extending 25 cm distally.  The fracture does include avulsion of the lesser trochanter.  Fracture does not appear to extend into the greater trochanter.  IMPRESSION: Spiral fracture of the proximal femoral shaft with angulation and displacement.   Original Report Authenticated By: Francene Boyers, M.D.   Dg Femur Right  09/21/2012   *RADIOLOGY REPORT*  Clinical Data: Right femur pain secondary to a fall.  RIGHT FEMUR - 2 VIEW  Comparison: None.  Findings: There is an angulated displaced comminuted spiral fracture of the proximal right femoral shaft  extending 25 cm distal to the femoral neck.  The lesser trochanter is avulsed.  There is diffuse osteopenia.  Distal femur is intact.  IMPRESSION: Fracture of the proximal right femoral shaft.   Original Report Authenticated By:  Francene Boyers, M.D.   1. Benign hypertensive heart disease without heart failure   2. IHSS (idiopathic hypertrophic subaortic stenosis)   3. Femur fracture, right, closed, initial encounter   4. Fall at home, initial encounter     MDM   I spoke with [facility] orthopedics, who is aware of the patient.  They will, round, and assess for surgery. Patient will be admitted to the hospitalist service  I spoke with Dr. Vonna Drafts, NP 09/21/12 2205

## 2012-09-21 NOTE — Progress Notes (Signed)
We are aware of admission and will see Tricia [name] first thing in AM.

## 2012-09-21 NOTE — Progress Notes (Signed)
Son answered questions for nursing admission history. He states that he lives with his mother. They do not have a computer and they are not interested in signing up for My Chart. Briscoe Burns BSN, RN-BC Admissions RN  09/21/2012 9:01 PM

## 2012-09-22 ENCOUNTER — Encounter (HOSPITAL_COMMUNITY): Payer: Self-pay | Admitting: Cardiovascular Disease

## 2012-09-22 DIAGNOSIS — Z01810 Encounter for preprocedural cardiovascular examination: Secondary | ICD-10-CM

## 2012-09-22 DIAGNOSIS — I4891 Unspecified atrial fibrillation: Secondary | ICD-10-CM

## 2012-09-22 DIAGNOSIS — I480 Paroxysmal atrial fibrillation: Secondary | ICD-10-CM | POA: Diagnosis present

## 2012-09-22 LAB — CBC
HCT: 31.9 % — ABNORMAL LOW (ref 36.0–46.0)
Hemoglobin: 10.9 g/dL — ABNORMAL LOW (ref 12.0–15.0)
MCH: 30.6 pg (ref 26.0–34.0)
MCV: 89.6 fL (ref 78.0–100.0)
RBC: 3.56 MIL/uL — ABNORMAL LOW (ref 3.87–5.11)

## 2012-09-22 LAB — BASIC METABOLIC PANEL
CO2: 28 mEq/L (ref 19–32)
Chloride: 98 mEq/L (ref 96–112)
GFR calc non Af Amer: 53 mL/min — ABNORMAL LOW (ref >90)
Glucose, Bld: 168 mg/dL — ABNORMAL HIGH (ref 70–99)
Potassium: 3.8 mEq/L (ref 3.5–5.1)
Sodium: 132 mEq/L — ABNORMAL LOW (ref 135–145)

## 2012-09-22 LAB — ABO/RH: ABO/RH(D): A POS

## 2012-09-22 MED ORDER — LETROZOLE 2.5 MG PO TABS
2.5000 mg | ORAL_TABLET | Freq: Every day | ORAL | Status: DC
  Administered 2012-09-22 – 2012-09-24 (×3): 2.5 mg via ORAL
  Filled 2012-09-22 (×4): qty 1

## 2012-09-22 MED ORDER — AMIODARONE HCL 200 MG PO TABS
200.0000 mg | ORAL_TABLET | Freq: Every day | ORAL | Status: DC
  Administered 2012-09-22 – 2012-09-24 (×3): 200 mg via ORAL
  Filled 2012-09-22 (×4): qty 1

## 2012-09-22 MED ORDER — DONEPEZIL HCL 5 MG PO TABS
5.0000 mg | ORAL_TABLET | Freq: Every day | ORAL | Status: DC
  Administered 2012-09-23: 5 mg via ORAL
  Filled 2012-09-22 (×2): qty 1

## 2012-09-22 NOTE — Consult Note (Addendum)
Patient ID: Tricia [name] MRN: [id number] DOB/AGE: 1925/11/30 79 y.o.  Admit date: 09/21/2012 Referring Physician: Robb Matar Primary Cardiologist: Patty Sermons Reason for Consultation: Pre-operative risk assessment  HPI: 79 yo female with history of hypertrophic subaortic stenosis, HTN, tobacco abuse, paroxysmal atrial fibrillation, breast cancer admitted after she tripped and fell at home and sustained a right femur fracture. Her cardiac issues are followed as an outpatient by Dr. Patty Sermons. Cardiology is asked to see for cardiac risk assessment prior to her planned surgical procedure tomorrow. Of note, she was admitted to [facility] February 2014 after a mechanical fall resulting in a significant laceration of her scalp requiring sutures. At the hospital she was found to be in atrial fibrillation with a rapid ventricular response. She had had a previous history of hypertensive heart disease without heart failure and she does have known IHSS. In the hospital in February she had problems with low blood pressure and bradycardia and she was taken off her previous beta blocker and placed on amiodarone 200 mg 3 times a day. On amiodarone she converted to normal sinus rhythm. The patient was also started on Xarelto in the hospital because of her atrial fibrillation and her IHSS. She was seen in the office by Dr. Patty Sermons 08/02/12 and at that time her amiodarone was lowered to 200 mg po once daily and Xarelto was continued for anticoagulation. She reports taking Xarelto every day up until today. (This is not included in her record of home medications as listed by pharmacy on admission but pt reports taking).   She tells me today that she has been feeling well. No chest pain, SOB, dizziness, near syncope or syncope. She is known to drink alcohol although it is not clear if this was related to her fall yesterday. She reports tripping on the carpet. No LOC or syncope. Severe right thigh pain post fall and  EMS called. She reports ongoing tobacco abuse.   Telemetry: NSR  Past Medical History  Diagnosis Date  . Idiopathic hypertrophic subaortic stenosis   . Irregular heart beat   . Fibrocystic breast disease   . Tobacco abuse   . Hypertension   . History of colonoscopy   . Blood transfusion 10 yrs ago    after bleeding ulcer in stomach  . Heart murmur   . Cancer     left breast  . Atrial fibrillation     On chronic Xarelto    Family History  Problem Relation Age of Onset  . Heart attack Mother   . Breast cancer Mother   . Cancer Mother     breast  . Diabetes Father   . Stomach cancer Brother   . Cancer Brother     colon    History   Social History  . Marital Status: Married    Spouse Name: N/A    Number of Children: N/A  . Years of Education: N/A   Occupational History  . Not on file.   Social History Main Topics  . Smoking status: Current Every Day Smoker -- 1.00 packs/day for 20 years    Types: Cigarettes  . Smokeless tobacco: Never Used  . Alcohol Use: Yes     Comment: 1.5 oz of rum in afternoon  . Drug Use: No  . Sexually Active: Not on file   Other Topics Concern  . Not on file   Social History Narrative  . No narrative on file    Past Surgical History:      .  Appendectomy  1946  . Right breast benign lump removed  yrs ago  . Breast surgery  10/21/11    simple mastectomy    No Known Allergies  Prescriptions prior to admission  Medication Sig Dispense Refill  . calcium-vitamin D (OSCAL WITH D) 500-200 MG-UNIT per tablet Take 1 tablet by mouth every evening.      . donepezil (ARICEPT) 5 MG tablet Take 5 mg by mouth at bedtime.       Marland Kitchen glucosamine-chondroitin 500-400 MG tablet Take 1 tablet by mouth daily.       Marland Kitchen letrozole (FEMARA) 2.5 MG tablet Take 1 tablet (2.5 mg total) by mouth daily. Please make follow up appointment  90 tablet  0  . metoprolol succinate (TOPROL-XL) 50 MG 24 hr tablet Take 1 tablet (50 mg total) by mouth daily. Take with or  immediately following a meal.  30 tablet  5  . Multiple Vitamin (MULTIVITAMIN WITH MINERALS) TABS Take 1 tablet by mouth daily.       Current Meds:  . docusate sodium  100 mg Oral BID  . letrozole  2.5 mg Oral Daily  . metoprolol succinate  50 mg Oral Daily   Review of systems complete and found to be negative unless listed above   Physical Exam: Blood pressure 156/89, pulse 70, temperature 97.9 F (36.6 C), temperature source Oral, resp. rate 20, height 5' 5.5" (1.664 m), weight 117 lb 15.1 oz (53.5 kg), SpO2 97.00%.    General: Well developed, well nourished, NAD  HEENT: OP clear, mucus membranes moist  SKIN: warm, dry. No rashes.  Neuro: No focal deficits  Musculoskeletal: Muscle strength 5/5 all ext  Psychiatric: Mood and affect normal  Neck: No JVD, no carotid bruits, no thyromegaly, no lymphadenopathy.  Lungs:Clear bilaterally, no wheezes, rhonci, crackles  Cardiovascular: Regular rate and rhythm. Harsh systolic murmur noted. No gallops or rubs.  Abdomen:Soft. Bowel sounds present. Non-tender.  Extremities: No lower extremity edema.  Labs:   Lab Results  Component Value Date   WBC 13.1* 09/22/2012   HGB 10.9* 09/22/2012   HCT 31.9* 09/22/2012   MCV 89.6 09/22/2012   PLT 174 09/22/2012     Recent Labs Lab 09/21/12 2105 09/22/12 0220  NA 135 132*  K 3.5 3.8  CL 97 98  CO2 25 28  BUN 21 23  CREATININE 0.88 0.95  CALCIUM 8.8 8.6  PROT 6.4  --   BILITOT 0.2*  --   ALKPHOS 47  --   ALT 11  --   AST 17  --   GLUCOSE 168* 168*    EKG: Unable to locate 12 lead EKG. Tele with NSR  ASSESSMENT AND PLAN:   1. Pre-operative examination prior to planned repair of right femur fracture 2. Paroxysmal atrial fibrillation 3. Idiopathic Hypertrophic subaortic stenosis   She seems to be stable from a cardiac standpoint. No recent chest pain, SOB or syncope. Her fall was a mechanical fall after tripping on the carpet. No loss of consciousness. She is known to have IHSS.  Would continue her beta blocker as written. Will restart home dose of amiodarone for rhythm control as she will not tolerate atrial fibrillation well. Would avoid pure vasodilators that drop afterload and increase LV outflow tract obstruction (such as hydralazine). Would recommend volume loading prior to her surgical procedure.  Echo February 2014 reviewed. No further cardiac workup before surgery tomorrow. I will get a 12 lead EKG tonight. We will follow with you. We will need  to have a discussion regarding long term anti-coagulation in this elderly female who has now had 2 falls in last 4 months and continues to drink alcohol.    Signed: MCALHANY,CHRISTOPHER 09/22/2012, 4:24 PM

## 2012-09-22 NOTE — Consult Note (Signed)
Reason for Consult: Fractured right femur Referring Physician: Hospitalist  HPI: Tricia [name] is an 79 y.o. female who tripped in home yesterday over step and fell onto hardwood floors. Severe onset of right thigh pain with inability to ambulate. Brought to [facility] ED and we were asked to see as she had been "long time patient of Dr. Fannie Knee"  Past Medical History  Diagnosis Date  . Idiopathic hypertrophic subaortic stenosis   . Irregular heart beat   . Fibrocystic breast disease   . Tobacco abuse   . Hypertension   . History of colonoscopy   . Blood transfusion 10 yrs ago    after bleeding ulcer in stomach  . Heart murmur   . Cancer     left breast    Past Surgical History  Procedure Laterality Date  . US echocardiography  12/05/2008    EF 60-65%  . US echocardiography  12/01/2007    EF 60-65%  . US echocardiography  07/01/2006    EF 55-60%  . US echocardiography  06/26/2005    EF 55-60%  . Cardiac catheterization  01/30/2002    EF 75-80%  . Appendectomy  1946  . Right breast benign lump removed  yrs ago  . Breast surgery  10/21/11    simple mastectomy    Family History  Problem Relation Age of Onset  . Heart attack Mother   . Breast cancer Mother   . Cancer Mother     breast  . Diabetes Father   . Stomach cancer Brother   . Cancer Brother     colon    Social History:  reports that she has been smoking Cigarettes.  She has a 20 pack-year smoking history. She has never used smokeless tobacco. She reports that  drinks alcohol. She reports that she does not use illicit drugs.  She lives with her youngest son.   Allergies: No Known Allergies  Medications: I have reviewed the patient's current medications.  Results for orders placed during the hospital encounter of 09/21/12 (from the past 48 hour(s))  URINALYSIS, ROUTINE W REFLEX MICROSCOPIC     Status: Abnormal   Collection Time    09/21/12  8:51 PM      Result Value Range   Color, Urine YELLOW  YELLOW   APPearance  CLEAR  CLEAR   Specific Gravity, Urine 1.016  1.005 - 1.030   pH 6.0  5.0 - 8.0   Glucose, UA 100 (*) NEGATIVE mg/dL   Hgb urine dipstick TRACE (*) NEGATIVE   Bilirubin Urine NEGATIVE  NEGATIVE   Ketones, ur NEGATIVE  NEGATIVE mg/dL   Protein, ur NEGATIVE  NEGATIVE mg/dL   Urobilinogen, UA 0.2  0.0 - 1.0 mg/dL   Nitrite NEGATIVE  NEGATIVE   Leukocytes, UA NEGATIVE  NEGATIVE  URINE MICROSCOPIC-ADD ON     Status: None   Collection Time    09/21/12  8:51 PM      Result Value Range   WBC, UA 0-2  <3 WBC/hpf   RBC / HPF 0-2  <3 RBC/hpf  CBC WITH DIFFERENTIAL     Status: Abnormal   Collection Time    09/21/12  9:05 PM      Result Value Range   WBC 10.8 (*) 4.0 - 10.5 K/uL   RBC 3.93  3.87 - 5.11 MIL/uL   Hemoglobin 12.7  12.0 - 15.0 g/dL   HCT 19.1 (*) 47.8 - 29.5 %   MCV 90.6  78.0 - 100.0 fL  MCH 32.3  26.0 - 34.0 pg   MCHC 35.7  30.0 - 36.0 g/dL   RDW 16.1  09.6 - 04.5 %   Platelets 174  150 - 400 K/uL   Neutrophils Relative % 87 (*) 43 - 77 %   Neutro Abs 9.4 (*) 1.7 - 7.7 K/uL   Lymphocytes Relative 8 (*) 12 - 46 %   Lymphs Abs 0.8  0.7 - 4.0 K/uL   Monocytes Relative 5  3 - 12 %   Monocytes Absolute 0.6  0.1 - 1.0 K/uL   Eosinophils Relative 0  0 - 5 %   Eosinophils Absolute 0.0  0.0 - 0.7 K/uL   Basophils Relative 0  0 - 1 %   Basophils Absolute 0.0  0.0 - 0.1 K/uL  COMPREHENSIVE METABOLIC PANEL     Status: Abnormal   Collection Time    09/21/12  9:05 PM      Result Value Range   Sodium 135  135 - 145 mEq/L   Potassium 3.5  3.5 - 5.1 mEq/L   Chloride 97  96 - 112 mEq/L   CO2 25  19 - 32 mEq/L   Glucose, Bld 168 (*) 70 - 99 mg/dL   BUN 21  6 - 23 mg/dL   Creatinine, Ser 4.09  0.50 - 1.10 mg/dL   Calcium 8.8  8.4 - 81.1 mg/dL   Total Protein 6.4  6.0 - 8.3 g/dL   Albumin 3.4 (*) 3.5 - 5.2 g/dL   AST 17  0 - 37 U/L   ALT 11  0 - 35 U/L   Alkaline Phosphatase 47  39 - 117 U/L   Total Bilirubin 0.2 (*) 0.3 - 1.2 mg/dL   GFR calc non Af Amer 59 (*) >90 mL/min    GFR calc Af Amer 68 (*) >90 mL/min   Comment:            The eGFR has been calculated     using the CKD EPI equation.     This calculation has not been     validated in all clinical     situations.     eGFR's persistently     <90 mL/min signify     possible Chronic Kidney Disease.  PROTIME-INR     Status: None   Collection Time    09/21/12  9:05 PM      Result Value Range   Prothrombin Time 12.9  11.6 - 15.2 seconds   INR 0.98  0.00 - 1.49  PROTIME-INR     Status: None   Collection Time    09/22/12  2:20 AM      Result Value Range   Prothrombin Time 14.0  11.6 - 15.2 seconds   INR 1.09  0.00 - 1.49  APTT     Status: None   Collection Time    09/22/12  2:20 AM      Result Value Range   aPTT 26  24 - 37 seconds  BASIC METABOLIC PANEL     Status: Abnormal   Collection Time    09/22/12  2:20 AM      Result Value Range   Sodium 132 (*) 135 - 145 mEq/L   Potassium 3.8  3.5 - 5.1 mEq/L   Chloride 98  96 - 112 mEq/L   CO2 28  19 - 32 mEq/L   Glucose, Bld 168 (*) 70 - 99 mg/dL   BUN 23  6 - 23 mg/dL  Creatinine, Ser 0.95  0.50 - 1.10 mg/dL   Calcium 8.6  8.4 - 78.2 mg/dL   GFR calc non Af Amer 53 (*) >90 mL/min   GFR calc Af Amer 62 (*) >90 mL/min   Comment:            The eGFR has been calculated     using the CKD EPI equation.     This calculation has not been     validated in all clinical     situations.     eGFR's persistently     <90 mL/min signify     possible Chronic Kidney Disease.  CBC     Status: Abnormal   Collection Time    09/22/12  2:20 AM      Result Value Range   WBC 13.1 (*) 4.0 - 10.5 K/uL   RBC 3.56 (*) 3.87 - 5.11 MIL/uL   Hemoglobin 10.9 (*) 12.0 - 15.0 g/dL   HCT 95.6 (*) 21.3 - 08.6 %   MCV 89.6  78.0 - 100.0 fL   MCH 30.6  26.0 - 34.0 pg   MCHC 34.2  30.0 - 36.0 g/dL   RDW 57.8  46.9 - 62.9 %   Platelets 174  150 - 400 K/uL  TYPE AND SCREEN     Status: None   Collection Time    09/22/12  2:20 AM      Result Value Range   ABO/RH(D) A  POS     Antibody Screen NEG     Sample Expiration 09/25/2012    ABO/RH     Status: None   Collection Time    09/22/12  2:20 AM      Result Value Range   ABO/RH(D) A POS      Dg Hip Complete Right  09/21/2012   *RADIOLOGY REPORT*  Clinical Data: Right hip pain secondary to a fall.  RIGHT HIP - COMPLETE 2+ VIEW  Comparison: None.  Findings: There is a comminuted spiral fracture of the proximal femoral shaft extending 25 cm distally.  The fracture does include avulsion of the lesser trochanter.  Fracture does not appear to extend into the greater trochanter.  IMPRESSION: Spiral fracture of the proximal femoral shaft with angulation and displacement.   Original Report Authenticated By: Francene Boyers, M.D.   Dg Femur Right  09/21/2012   *RADIOLOGY REPORT*  Clinical Data: Right femur pain secondary to a fall.  RIGHT FEMUR - 2 VIEW  Comparison: None.  Findings: There is an angulated displaced comminuted spiral fracture of the proximal right femoral shaft extending 25 cm distal to the femoral neck.  The lesser trochanter is avulsed.  There is diffuse osteopenia.  Distal femur is intact.  IMPRESSION: Fracture of the proximal right femoral shaft.   Original Report Authenticated By: Francene Boyers, M.D.    ROS: patient denies any other pain  Physical Exam:  Alert and appropriate. Some "fogginess"as to today and date Right LE held in flexed position for comfort Moves foot well and NVI  Vitals Temp:  [97.9 F (36.6 C)-98.7 F (37.1 C)] 98.7 F (37.1 C) (06/25 0753) Pulse Rate:  [67-85] 71 (06/25 0753) Resp:  [12-20] 20 (06/25 0753) BP: (117-215)/(70-95) 120/80 mmHg (06/25 0753) SpO2:  [92 %-99 %] 97 % (06/25 0753) Weight:  [53.5 kg (117 lb 15.1 oz)] 53.5 kg (117 lb 15.1 oz) (06/24 2337) Body mass index is 19.32 kg/(m^2).  Assessment/Plan: Impression: Right femur fracture Treatment: She is currently NPO. She will need Intramedullary  nailing of the above femur fracture, likely antegrade. Will  look into the OR schedule and leave further orders based on availability.  Continue bedrest, position of comfort and prn analgesics  Phung Kotas for Dr. Caryn Bee Supple 09/22/2012, 8:05 AM      Addendum  Dr. Charlann Boxer will take over Tricia [name]'s care and will add her on for surgery tomorrow. Will add diet and make npo after midnight

## 2012-09-22 NOTE — ED Provider Notes (Signed)
Medical screening examination/treatment/procedure(s) were conducted as a shared visit with non-physician practitioner(s) and myself.  I personally evaluated the patient during the encounter.  Derwood Kaplan, MD 09/22/12 [phone]

## 2012-09-22 NOTE — Progress Notes (Signed)
Patient seems a bit confused, reoriented to reason for hospitalization, bedrest, surgery for tomorrow due to broken leg.  She wanted to get up and walk in the hall, explained again about leg fracture and need for bedrest.

## 2012-09-22 NOTE — Progress Notes (Signed)
Received central telemetry text regarding lead off, lead reattached signal being received

## 2012-09-22 NOTE — Progress Notes (Signed)
Phoned OR to verify that patient's surgery had been scheduled and what time.  The staff stated the surgeon had not scheduled surgery yet, notified patient and her son,

## 2012-09-22 NOTE — Progress Notes (Signed)
[facility] PROGRESS NOTE   Assessment/Plan:  Femur fracture, right: - Ortho consulted. schedule surgery on 6.26.2014 - She does have ideopathi subaortic stenosis echo does no show a gradient. I will consult her cardiologist for pre-op clearance. - PT/INR <1.0. - pt consult after surgery.  Fall at home - mechanical.  Benign hypertensive heart disease without heart failure: - cont home meds.  IHSS (idiopathic hypertrophic subaortic stenosis) - consult cardiology    Code Status: full Family Communication: none  Disposition Plan: inpatient   Consultants:  Dr. Madelin Rear  cardiology  Procedures:  none  Antibiotics:  none   HPI/Subjective: Complaing of right hip pain.   Objective: Filed Vitals:   09/21/12 2200 09/21/12 2337 09/22/12 0613 09/22/12 0753  BP: 117/74 140/95 120/70 120/80  Pulse: 81 84 74 71  Temp:  98.5 F (36.9 C) 98.6 F (37 C) 98.7 F (37.1 C)  TempSrc:  Oral Oral Oral  Resp:    20  Height:  5' 5.5" (1.664 m)    Weight:  53.5 kg (117 lb 15.1 oz)    SpO2: 95% 98% 99% 97%   No intake or output data in the 24 hours ending 09/22/12 0858 Filed Weights   09/21/12 2337  Weight: 53.5 kg (117 lb 15.1 oz)    Exam:  General: Alert, awake, oriented x3, in no acute distress.  HEENT: No bruits, no goiter.  Heart: Regular rate and rhythm, without murmurs, rubs, gallops.  Lungs: Good air movement, clear to ausultation.  Abdomen: Soft, nontender, nondistended, positive bowel sounds.  Neuro: Grossly intact, nonfocal.   Data Reviewed: Basic Metabolic Panel:  Recent Labs Lab 09/21/12 2105 09/22/12 0220  NA 135 132*  K 3.5 3.8  CL 97 98  CO2 25 28  GLUCOSE 168* 168*  BUN 21 23  CREATININE 0.88 0.95  CALCIUM 8.8 8.6   Liver Function Tests:  Recent Labs Lab 09/21/12 2105  AST 17  ALT 11  ALKPHOS 47  BILITOT 0.2*  PROT 6.4  ALBUMIN 3.4*   No results found for this basename: LIPASE, AMYLASE,  in the last 168 hours No  results found for this basename: AMMONIA,  in the last 168 hours CBC:  Recent Labs Lab 09/21/12 2105 09/22/12 0220  WBC 10.8* 13.1*  NEUTROABS 9.4*  --   HGB 12.7 10.9*  HCT 35.6* 31.9*  MCV 90.6 89.6  PLT 174 174   Cardiac Enzymes: No results found for this basename: CKTOTAL, CKMB, CKMBINDEX, TROPONINI,  in the last 168 hours BNP (last 3 results) No results found for this basename: PROBNP,  in the last 8760 hours CBG: No results found for this basename: GLUCAP,  in the last 168 hours  No results found for this or any previous visit (from the past 240 hour(s)).   Studies: Dg Hip Complete Right  09/21/2012   *RADIOLOGY REPORT*  Clinical Data: Right hip pain secondary to a fall.  RIGHT HIP - COMPLETE 2+ VIEW  Comparison: None.  Findings: There is a comminuted spiral fracture of the proximal femoral shaft extending 25 cm distally.  The fracture does include avulsion of the lesser trochanter.  Fracture does not appear to extend into the greater trochanter.  IMPRESSION: Spiral fracture of the proximal femoral shaft with angulation and displacement.   Original Report Authenticated By: Francene Boyers, M.D.   Dg Femur Right  09/21/2012   *RADIOLOGY REPORT*  Clinical Data: Right femur pain secondary to a fall.  RIGHT FEMUR - 2 VIEW  Comparison:  None.  Findings: There is an angulated displaced comminuted spiral fracture of the proximal right femoral shaft extending 25 cm distal to the femoral neck.  The lesser trochanter is avulsed.  There is diffuse osteopenia.  Distal femur is intact.  IMPRESSION: Fracture of the proximal right femoral shaft.   Original Report Authenticated By: Francene Boyers, M.D.    Scheduled Meds: . docusate sodium  100 mg Oral BID  . metoprolol succinate  50 mg Oral Daily   Continuous Infusions: . sodium chloride 75 mL/hr at 09/22/12 0547     Marinda Elk  [facility] Pager [phone]. If 8PM-8AM, please contact night-coverage at [email or web address],  password [facility] 09/22/2012, 8:58 AM  LOS: 1 day

## 2012-09-23 ENCOUNTER — Inpatient Hospital Stay (HOSPITAL_COMMUNITY): Payer: Medicare Other | Admitting: Anesthesiology

## 2012-09-23 ENCOUNTER — Inpatient Hospital Stay (HOSPITAL_COMMUNITY): Payer: Medicare Other

## 2012-09-23 ENCOUNTER — Encounter (HOSPITAL_COMMUNITY): Payer: Self-pay | Admitting: Certified Registered Nurse Anesthetist

## 2012-09-23 ENCOUNTER — Encounter (HOSPITAL_COMMUNITY)
Admission: EM | Disposition: A | Discharge status: Skilled Nursing Facility | Payer: Self-pay | Source: Home / Self Care | Attending: Internal Medicine

## 2012-09-23 ENCOUNTER — Encounter (HOSPITAL_COMMUNITY): Payer: Self-pay | Admitting: Anesthesiology

## 2012-09-23 HISTORY — PX: ORIF FEMUR FRACTURE: SHX2119

## 2012-09-23 SURGERY — OPEN REDUCTION INTERNAL FIXATION (ORIF) DISTAL FEMUR FRACTURE
Anesthesia: General | Laterality: Right | Wound class: Clean

## 2012-09-23 MED ORDER — PROPOFOL 10 MG/ML IV BOLUS
INTRAVENOUS | Status: DC | PRN
  Administered 2012-09-23: 70 mg via INTRAVENOUS

## 2012-09-23 MED ORDER — PHENOL 1.4 % MT LIQD: 1.0000 | OROMUCOSAL | Status: DC | PRN

## 2012-09-23 MED ORDER — CEFAZOLIN SODIUM-DEXTROSE 2-3 GM-% IV SOLR
2.0000 g | Freq: Four times a day (QID) | INTRAVENOUS | Status: AC
  Administered 2012-09-23 – 2012-09-24 (×2): 2 g via INTRAVENOUS
  Filled 2012-09-23 (×2): qty 50

## 2012-09-23 MED ORDER — LACTATED RINGERS IV SOLN
INTRAVENOUS | Status: DC | PRN
  Administered 2012-09-23: 18:00:00 via INTRAVENOUS

## 2012-09-23 MED ORDER — SUCCINYLCHOLINE CHLORIDE 20 MG/ML IJ SOLN
INTRAMUSCULAR | Status: DC | PRN
  Administered 2012-09-23: 70 mg via INTRAVENOUS

## 2012-09-23 MED ORDER — CEFAZOLIN SODIUM-DEXTROSE 2-3 GM-% IV SOLR: 2.0000 g | INTRAVENOUS | Status: DC

## 2012-09-23 MED ORDER — FERROUS SULFATE 325 (65 FE) MG PO TABS
325.0000 mg | ORAL_TABLET | Freq: Three times a day (TID) | ORAL | Status: DC
  Administered 2012-09-24 (×2): 325 mg via ORAL
  Filled 2012-09-23 (×4): qty 1

## 2012-09-23 MED ORDER — METOCLOPRAMIDE HCL 5 MG/ML IJ SOLN: 5.0000 mg | Freq: Three times a day (TID) | INTRAMUSCULAR | Status: DC | PRN

## 2012-09-23 MED ORDER — FENTANYL CITRATE 0.05 MG/ML IJ SOLN
25.0000 ug | INTRAMUSCULAR | Status: DC | PRN
  Administered 2012-09-23 (×3): 25 ug via INTRAVENOUS
  Filled 2012-09-23: qty 2

## 2012-09-23 MED ORDER — ROCURONIUM BROMIDE 100 MG/10ML IV SOLN
INTRAVENOUS | Status: DC | PRN
  Administered 2012-09-23: 20 mg via INTRAVENOUS

## 2012-09-23 MED ORDER — LACTATED RINGERS IV SOLN
INTRAVENOUS | Status: DC
  Administered 2012-09-23: 1000 mL via INTRAVENOUS

## 2012-09-23 MED ORDER — FENTANYL CITRATE 0.05 MG/ML IJ SOLN
INTRAMUSCULAR | Status: DC | PRN
  Administered 2012-09-23 (×4): 25 ug via INTRAVENOUS

## 2012-09-23 MED ORDER — LORAZEPAM 2 MG/ML IJ SOLN
0.5000 mg | Freq: Once | INTRAMUSCULAR | Status: AC
  Administered 2012-09-23: 0.5 mg via INTRAVENOUS
  Filled 2012-09-23: qty 1

## 2012-09-23 MED ORDER — ONDANSETRON HCL 4 MG PO TABS: 4.0000 mg | ORAL_TABLET | Freq: Four times a day (QID) | ORAL | Status: DC | PRN

## 2012-09-23 MED ORDER — ONDANSETRON HCL 4 MG/2ML IJ SOLN
INTRAMUSCULAR | Status: DC | PRN
  Administered 2012-09-23: 4 mg via INTRAVENOUS

## 2012-09-23 MED ORDER — GLYCOPYRROLATE 0.2 MG/ML IJ SOLN
INTRAMUSCULAR | Status: DC | PRN
  Administered 2012-09-23: 0.4 mg via INTRAVENOUS

## 2012-09-23 MED ORDER — 0.9 % SODIUM CHLORIDE (POUR BTL) OPTIME
TOPICAL | Status: DC | PRN
  Administered 2012-09-23: 1000 mL

## 2012-09-23 MED ORDER — METOCLOPRAMIDE HCL 10 MG PO TABS: 5.0000 mg | ORAL_TABLET | Freq: Three times a day (TID) | ORAL | Status: DC | PRN

## 2012-09-23 MED ORDER — LIDOCAINE HCL (CARDIAC) 20 MG/ML IV SOLN
INTRAVENOUS | Status: DC | PRN
  Administered 2012-09-23: 100 mg via INTRAVENOUS

## 2012-09-23 MED ORDER — PHENYLEPHRINE HCL 10 MG/ML IJ SOLN
INTRAMUSCULAR | Status: DC | PRN
  Administered 2012-09-23: 20 ug via INTRAVENOUS
  Administered 2012-09-23 (×7): 40 ug via INTRAVENOUS

## 2012-09-23 MED ORDER — MENTHOL 3 MG MT LOZG: 1.0000 | LOZENGE | OROMUCOSAL | Status: DC | PRN

## 2012-09-23 MED ORDER — ACETAMINOPHEN 325 MG PO TABS: 650.0000 mg | ORAL_TABLET | Freq: Four times a day (QID) | ORAL | Status: DC | PRN

## 2012-09-23 MED ORDER — METHOCARBAMOL 100 MG/ML IJ SOLN
500.0000 mg | Freq: Four times a day (QID) | INTRAVENOUS | Status: DC | PRN
  Filled 2012-09-23: qty 5

## 2012-09-23 MED ORDER — METHOCARBAMOL 500 MG PO TABS: 500.0000 mg | ORAL_TABLET | Freq: Four times a day (QID) | ORAL | Status: DC | PRN

## 2012-09-23 MED ORDER — ENOXAPARIN SODIUM 40 MG/0.4ML ~~LOC~~ SOLN
40.0000 mg | SUBCUTANEOUS | Status: DC
  Administered 2012-09-24: 40 mg via SUBCUTANEOUS
  Filled 2012-09-23 (×2): qty 0.4

## 2012-09-23 MED ORDER — NEOSTIGMINE METHYLSULFATE 1 MG/ML IJ SOLN
INTRAMUSCULAR | Status: DC | PRN
  Administered 2012-09-23: 4 mg via INTRAVENOUS

## 2012-09-23 MED ORDER — KETAMINE HCL 10 MG/ML IJ SOLN
INTRAMUSCULAR | Status: DC | PRN
  Administered 2012-09-23: 20 mg via INTRAVENOUS

## 2012-09-23 MED ORDER — PROMETHAZINE HCL 25 MG/ML IJ SOLN: 6.2500 mg | INTRAMUSCULAR | Status: DC | PRN

## 2012-09-23 MED ORDER — ONDANSETRON HCL 4 MG/2ML IJ SOLN: 4.0000 mg | Freq: Four times a day (QID) | INTRAMUSCULAR | Status: DC | PRN

## 2012-09-23 MED ORDER — ESMOLOL HCL 10 MG/ML IV SOLN
INTRAVENOUS | Status: DC | PRN
  Administered 2012-09-23 (×2): 10 mg via INTRAVENOUS

## 2012-09-23 MED ORDER — ACETAMINOPHEN 650 MG RE SUPP: 650.0000 mg | Freq: Four times a day (QID) | RECTAL | Status: DC | PRN

## 2012-09-23 MED ORDER — EPHEDRINE SULFATE 50 MG/ML IJ SOLN
INTRAMUSCULAR | Status: DC | PRN
  Administered 2012-09-23: 5 mg via INTRAVENOUS
  Administered 2012-09-23: 10 mg via INTRAVENOUS
  Administered 2012-09-23: 5 mg via INTRAVENOUS
  Administered 2012-09-23: 2.5 mg via INTRAVENOUS
  Administered 2012-09-23: 5 mg via INTRAVENOUS
  Administered 2012-09-23: 2.5 mg via INTRAVENOUS

## 2012-09-23 SURGICAL SUPPLY — 48 items
BAG ZIPLOCK 12X15 (MISCELLANEOUS) ×2 IMPLANT
BANDAGE GAUZE ELAST BULKY 4 IN (GAUZE/BANDAGES/DRESSINGS) ×2 IMPLANT
BIT DRILL 3.8X6 NS (BIT) ×2 IMPLANT
BIT DRILL 6.5X4.8 (BIT) ×2 IMPLANT
BNDG COHESIVE 4X5 TAN STRL (GAUZE/BANDAGES/DRESSINGS) IMPLANT
CLOTH BEACON ORANGE TIMEOUT ST (SAFETY) ×2 IMPLANT
COVER MAYO STAND STRL (DRAPES) ×2 IMPLANT
DRSG EMULSION OIL 3X16 NADH (GAUZE/BANDAGES/DRESSINGS) ×2 IMPLANT
DRSG MEPILEX BORDER 4X4 (GAUZE/BANDAGES/DRESSINGS) ×4 IMPLANT
DRSG MEPILEX BORDER 4X8 (GAUZE/BANDAGES/DRESSINGS) ×2 IMPLANT
DURAPREP 26ML APPLICATOR (WOUND CARE) ×2 IMPLANT
ELECT REM PT RETURN 9FT ADLT (ELECTROSURGICAL) ×2
ELECTRODE REM PT RTRN 9FT ADLT (ELECTROSURGICAL) ×1 IMPLANT
GLOVE BIOGEL PI IND STRL 6.5 (GLOVE) ×1 IMPLANT
GLOVE BIOGEL PI IND STRL 7.5 (GLOVE) ×2 IMPLANT
GLOVE BIOGEL PI IND STRL 8 (GLOVE) ×1 IMPLANT
GLOVE BIOGEL PI INDICATOR 6.5 (GLOVE) ×1
GLOVE BIOGEL PI INDICATOR 7.5 (GLOVE) ×2
GLOVE BIOGEL PI INDICATOR 8 (GLOVE) ×1
GLOVE ECLIPSE 8.0 STRL XLNG CF (GLOVE) IMPLANT
GLOVE ORTHO TXT STRL SZ7.5 (GLOVE) ×4 IMPLANT
GLOVE SURG ORTHO 8.0 STRL STRW (GLOVE) ×2 IMPLANT
GLOVE SURG SS PI 6.5 STRL IVOR (GLOVE) ×2 IMPLANT
GLOVE SURG SS PI 7.5 STRL IVOR (GLOVE) ×4 IMPLANT
GOWN BRE IMP PREV XXLGXLNG (GOWN DISPOSABLE) ×4 IMPLANT
GOWN STRL NON-REIN LRG LVL3 (GOWN DISPOSABLE) ×4 IMPLANT
GUIDEPIN 3.2X17.5 THRD DISP (PIN) ×4 IMPLANT
GUIDEWIRE BALL NOSE 100CM (WIRE) ×2 IMPLANT
KIT BASIN OR (CUSTOM PROCEDURE TRAY) ×2 IMPLANT
MANIFOLD NEPTUNE II (INSTRUMENTS) ×2 IMPLANT
NAIL TROCH RH 11MMX38CM (Nail) ×2 IMPLANT
NS IRRIG 1000ML POUR BTL (IV SOLUTION) ×2 IMPLANT
PACK TOTAL JOINT (CUSTOM PROCEDURE TRAY) ×2 IMPLANT
PADDING CAST COTTON 6X4 STRL (CAST SUPPLIES) ×2 IMPLANT
POSITIONER SURGICAL ARM (MISCELLANEOUS) ×2 IMPLANT
SCREW ACECAP 46MM (Screw) ×2 IMPLANT
SCREW CANCELLOUS 6.5X65 (Screw) ×2 IMPLANT
SCREW LAG 6.5MMX85MM (Screw) ×2 IMPLANT
SPONGE GAUZE 4X4 12PLY (GAUZE/BANDAGES/DRESSINGS) IMPLANT
STAPLER VISISTAT 35W (STAPLE) ×4 IMPLANT
STRIP CLOSURE SKIN 1/2X4 (GAUZE/BANDAGES/DRESSINGS) IMPLANT
SUT MNCRL AB 4-0 PS2 18 (SUTURE) IMPLANT
SUT VIC AB 1 CT1 36 (SUTURE) ×4 IMPLANT
SUT VIC AB 2-0 CT1 27 (SUTURE) ×3
SUT VIC AB 2-0 CT1 TAPERPNT 27 (SUTURE) ×3 IMPLANT
SUT WIRE 16GA (Orthopedic Implant) ×4 IMPLANT
TOWEL OR 17X26 10 PK STRL BLUE (TOWEL DISPOSABLE) ×4 IMPLANT
WATER STERILE IRR 1500ML POUR (IV SOLUTION) ×2 IMPLANT

## 2012-09-23 NOTE — Progress Notes (Signed)
Patient ID: Tricia [name] MRN: [id number] DOB/AGE: 77/29/1927 77 y.o.  Admit date: 09/21/2012 Referring Physician: Robb Matar Primary Cardiologist: Patty Sermons Reason for Consultation: Pre-operative risk assessment  HPI: 79 yo female with history of hypertrophic subaortic stenosis, HTN, tobacco abuse, paroxysmal atrial fibrillation, breast cancer admitted after she tripped and fell at home and sustained a right femur fracture. Her cardiac issues are followed as an outpatient by Dr. Patty Sermons. Cardiology is asked to see for cardiac risk assessment prior to her planned surgical procedure tomorrow. Of note, she was admitted to [facility] February 2014 after a mechanical fall resulting in a significant laceration of her scalp requiring sutures. At the hospital she was found to be in atrial fibrillation with a rapid ventricular response. She had had a previous history of hypertensive heart disease without heart failure and she does have known IHSS. In the hospital in February she had problems with low blood pressure and bradycardia and she was taken off her previous beta blocker and placed on amiodarone 200 mg 3 times a day. On amiodarone she converted to normal sinus rhythm. The patient was also started on Xarelto in the hospital because of her atrial fibrillation and her IHSS. She was seen in the office by Dr. Patty Sermons 08/02/12 and at that time her amiodarone was lowered to 200 mg po once daily and Xarelto was continued for anticoagulation. She reports taking Xarelto every day up until today. (This is not included in her record of home medications as listed by pharmacy on admission but pt reports taking).   She tells me today that she has been feeling well. No chest pain, SOB, dizziness, near syncope or syncope. She is known to drink alcohol although it is not clear if this was related to her fall yesterday. She reports tripping on the carpet. No LOC or syncope. Severe right thigh pain post fall and  EMS called. She reports ongoing tobacco abuse.   Telemetry: NSR  Overnight she has done well.  No cardiac issues.  Past Medical History  Diagnosis Date  . Idiopathic hypertrophic subaortic stenosis   . Irregular heart beat   . Fibrocystic breast disease   . Tobacco abuse   . Hypertension   . History of colonoscopy   . Blood transfusion 10 yrs ago    after bleeding ulcer in stomach  . Heart murmur   . Cancer     left breast  . Atrial fibrillation     On chronic Xarelto    Family History  Problem Relation Age of Onset  . Heart attack Mother   . Breast cancer Mother   . Cancer Mother     breast  . Diabetes Father   . Stomach cancer Brother   . Cancer Brother     colon    History   Social History  . Marital Status: Married    Spouse Name: N/A    Number of Children: N/A  . Years of Education: N/A   Occupational History  . Not on file.   Social History Main Topics  . Smoking status: Current Every Day Smoker -- 1.00 packs/day for 20 years    Types: Cigarettes  . Smokeless tobacco: Never Used  . Alcohol Use: Yes     Comment: 1.5 oz of rum in afternoon  . Drug Use: No  . Sexually Active: Not on file   Other Topics Concern  . Not on file   Social History Narrative  . No narrative on file  Past Surgical History:      . Appendectomy  1946  . Right breast benign lump removed  yrs ago  . Breast surgery  10/21/11    simple mastectomy    No Known Allergies  Prescriptions prior to admission  Medication Sig Dispense Refill  . calcium-vitamin D (OSCAL WITH D) 500-200 MG-UNIT per tablet Take 1 tablet by mouth every evening.      . donepezil (ARICEPT) 5 MG tablet Take 5 mg by mouth at bedtime.       Marland Kitchen glucosamine-chondroitin 500-400 MG tablet Take 1 tablet by mouth daily.       Marland Kitchen letrozole (FEMARA) 2.5 MG tablet Take 1 tablet (2.5 mg total) by mouth daily. Please make follow up appointment  90 tablet  0  . metoprolol succinate (TOPROL-XL) 50 MG 24 hr tablet Take  1 tablet (50 mg total) by mouth daily. Take with or immediately following a meal.  30 tablet  5  . Multiple Vitamin (MULTIVITAMIN WITH MINERALS) TABS Take 1 tablet by mouth daily.       Current Meds:  . amiodarone  200 mg Oral Daily  . docusate sodium  100 mg Oral BID  . donepezil  5 mg Oral QHS  . letrozole  2.5 mg Oral Daily  . metoprolol succinate  50 mg Oral Daily      Physical Exam: Blood pressure 120/71, pulse 70, temperature 98.5 F (36.9 C), temperature source Oral, resp. rate 16, height 5' 5.5" (1.664 m), weight 117 lb 15.1 oz (53.5 kg), SpO2 97.00%.    General: Well developed, well nourished, NAD  HEENT: OP clear, mucus membranes moist  SKIN: warm, dry. No rashes.  Neuro: No focal deficits  Musculoskeletal: Muscle strength 5/5 all ext  Psychiatric: Mood and affect normal  Neck: No JVD, no carotid bruits, no thyromegaly, no lymphadenopathy.  Lungs:Clear bilaterally, no wheezes, rhonci, crackles  Cardiovascular: Regular rate and rhythm. Harsh systolic murmur noted. No gallops or rubs. S/p left mastectomy Abdomen:Soft. Bowel sounds present. Non-tender.  Extremities: No lower extremity edema.  Labs:   Lab Results  Component Value Date   WBC 13.1* 09/22/2012   HGB 10.9* 09/22/2012   HCT 31.9* 09/22/2012   MCV 89.6 09/22/2012   PLT 174 09/22/2012     Recent Labs Lab 09/21/12 2105 09/22/12 0220  NA 135 132*  K 3.5 3.8  CL 97 98  CO2 25 28  BUN 21 23  CREATININE 0.88 0.95  CALCIUM 8.8 8.6  PROT 6.4  --   BILITOT 0.2*  --   ALKPHOS 47  --   ALT 11  --   AST 17  --   GLUCOSE 168* 168*    EKG:  Tele - NSR  ASSESSMENT AND PLAN:   1. Pre-operative examination prior to planned repair of right femur fracture 2. Paroxysmal atrial fibrillation 3. Idiopathic Hypertrophic subaortic stenosis   She seems to be stable from a cardiac standpoint. No recent chest pain, SOB or syncope.   Would avoid pure vasodilators that drop afterload and increase LV outflow tract  obstruction (such as hydralazine). Would recommend volume loading prior to her surgical procedure.  Echo February 2014 reviewed. No further cardiac workup before surgery tomorrow.  We will follow with you.   Vesta Mixer, Montez Hageman., MD, [facility] 09/23/2012, 7:37 AM Office - [phone] Pager [phone]

## 2012-09-23 NOTE — Anesthesia Postprocedure Evaluation (Signed)
  Anesthesia Post-op Note  Patient: Tricia [name]  Procedure(s) Performed: Procedure(s) (LRB): OPEN REDUCTION INTERNAL FIXATION (ORIF) RIGHT DISTAL FEMUR FRACTURE (Right)  Patient Location: PACU  Anesthesia Type: General  Level of Consciousness: awake and alert   Airway and Oxygen Therapy: Patient Spontanous Breathing  Post-op Pain: mild  Post-op Assessment: Post-op Vital signs reviewed, Patient's Cardiovascular Status Stable, Respiratory Function Stable, Patent Airway and No signs of Nausea or vomiting  Last Vitals:  Filed Vitals:   09/23/12 1925  BP: 163/86  Pulse: 73  Temp:   Resp: 18    Post-op Vital Signs: stable   Complications: No apparent anesthesia complications

## 2012-09-23 NOTE — Preoperative (Signed)
Beta Blockers   Reason not to administer Beta Blockers:Not Applicable Pt took Beta Blocker today 09-23-12

## 2012-09-23 NOTE — Transfer of Care (Signed)
Immediate Anesthesia Transfer of Care Note  Patient: Tricia [name]  Procedure(s) Performed: Procedure(s): OPEN REDUCTION INTERNAL FIXATION (ORIF) RIGHT DISTAL FEMUR FRACTURE (Right)  Patient Location: PACU  Anesthesia Type:General  Level of Consciousness: awake, oriented and patient cooperative  Airway & Oxygen Therapy: Patient Spontanous Breathing and Patient connected to face mask oxygen  Post-op Assessment: Report given to PACU RN and Post -op Vital signs reviewed and stable  Post vital signs: Reviewed and stable  Complications: No apparent anesthesia complications

## 2012-09-23 NOTE — Progress Notes (Signed)
[facility] PROGRESS NOTE   Assessment/Plan:  Femur fracture, right: - Ortho consulted. Schedule surgery on 6.26.2014 - Cardiology consulted recommended: IV fuids before surgery, avoid afterload reducers and resume amiodarone. - PT/INR <1.0. - pt consult after surgery.  Fall at home - mechanical.  Benign hypertensive heart disease without heart failure: - cont home meds.  IHSS (idiopathic hypertrophic subaortic stenosis) - consult cardiology    Code Status: full Family Communication: none  Disposition Plan: inpatient   Consultants:  Dr. Madelin Rear  cardiology  Procedures:  none  Antibiotics:  none   HPI/Subjective: Pain controlled.   Objective: Filed Vitals:   09/22/12 2123 09/23/12 0000 09/23/12 0400 09/23/12 0612  BP: 151/91   120/71  Pulse: 65   70  Temp: 98.6 F (37 C)   98.5 F (36.9 C)  TempSrc: Oral   Oral  Resp: 20 16 16 16   Height:      Weight:      SpO2: 99% 99% 99% 97%    Intake/Output Summary (Last 24 hours) at 09/23/12 0751 Last data filed at 09/23/12 5366  Gross per 24 hour  Intake 1637.5 ml  Output   1000 ml  Net  637.5 ml   Filed Weights   09/21/12 2337  Weight: 53.5 kg (117 lb 15.1 oz)    Exam:  General: Alert, awake, oriented x3, in no acute distress.  HEENT: No bruits, no goiter.  Heart: Regular rate and rhythm, without murmurs, rubs, gallops.  Lungs: Good air movement, clear to ausultation.     Data Reviewed: Basic Metabolic Panel:  Recent Labs Lab 09/21/12 2105 09/22/12 0220  NA 135 132*  K 3.5 3.8  CL 97 98  CO2 25 28  GLUCOSE 168* 168*  BUN 21 23  CREATININE 0.88 0.95  CALCIUM 8.8 8.6   Liver Function Tests:  Recent Labs Lab 09/21/12 2105  AST 17  ALT 11  ALKPHOS 47  BILITOT 0.2*  PROT 6.4  ALBUMIN 3.4*   No results found for this basename: LIPASE, AMYLASE,  in the last 168 hours No results found for this basename: AMMONIA,  in the last 168 hours CBC:  Recent Labs Lab  09/21/12 2105 09/22/12 0220  WBC 10.8* 13.1*  NEUTROABS 9.4*  --   HGB 12.7 10.9*  HCT 35.6* 31.9*  MCV 90.6 89.6  PLT 174 174   Cardiac Enzymes: No results found for this basename: CKTOTAL, CKMB, CKMBINDEX, TROPONINI,  in the last 168 hours BNP (last 3 results) No results found for this basename: PROBNP,  in the last 8760 hours CBG: No results found for this basename: GLUCAP,  in the last 168 hours  Recent Results (from the past 240 hour(s))  SURGICAL PCR SCREEN     Status: None   Collection Time    09/22/12  6:25 AM      Result Value Range Status   MRSA, PCR NEGATIVE  NEGATIVE Final   Staphylococcus aureus NEGATIVE  NEGATIVE Final   Comment:            The Xpert SA Assay (FDA     approved for NASAL specimens     in patients over 42 years of age),     is one component of     a comprehensive surveillance     program.  Test performance has     been validated by [employer] for patients greater     than or equal to 40 year old.  It is not intended     to diagnose infection nor to     guide or monitor treatment.     Studies: Dg Hip Complete Right  09/21/2012   *RADIOLOGY REPORT*  Clinical Data: Right hip pain secondary to a fall.  RIGHT HIP - COMPLETE 2+ VIEW  Comparison: None.  Findings: There is a comminuted spiral fracture of the proximal femoral shaft extending 25 cm distally.  The fracture does include avulsion of the lesser trochanter.  Fracture does not appear to extend into the greater trochanter.  IMPRESSION: Spiral fracture of the proximal femoral shaft with angulation and displacement.   Original Report Authenticated By: Francene Boyers, M.D.   Dg Femur Right  09/21/2012   *RADIOLOGY REPORT*  Clinical Data: Right femur pain secondary to a fall.  RIGHT FEMUR - 2 VIEW  Comparison: None.  Findings: There is an angulated displaced comminuted spiral fracture of the proximal right femoral shaft extending 25 cm distal to the femoral neck.  The lesser trochanter is  avulsed.  There is diffuse osteopenia.  Distal femur is intact.  IMPRESSION: Fracture of the proximal right femoral shaft.   Original Report Authenticated By: Francene Boyers, M.D.    Scheduled Meds: . amiodarone  200 mg Oral Daily  . docusate sodium  100 mg Oral BID  . donepezil  5 mg Oral QHS  . letrozole  2.5 mg Oral Daily  . metoprolol succinate  50 mg Oral Daily   Continuous Infusions:     Marinda Elk  [facility] Pager [phone]. If 8PM-8AM, please contact night-coverage at [email or web address], password [facility] 09/23/2012, 7:51 AM  LOS: 2 days

## 2012-09-23 NOTE — Anesthesia Preprocedure Evaluation (Addendum)
Anesthesia Evaluation  Patient identified by MRN, date of birth, ID band Patient awake    Reviewed: Allergy & Precautions, H&P , NPO status , Patient's Chart, lab work & pertinent test results  Airway Mallampati: II TM Distance: >3 FB Neck ROM: Full    Dental no notable dental hx.    Pulmonary neg pulmonary ROS,  breath sounds clear to auscultation  Pulmonary exam normal       Cardiovascular hypertension, Pt. on medications + dysrhythmias Atrial Fibrillation Rhythm:Regular Rate:Normal  Idiopathic hypertrophic subaortic stenosis  Left ventricle:  Rhythm is irregular. The LV is hyperdynamic with the EF 75-80%. There is upper septal thickening. There is a LV outflow gradient that is not recorded well. There is SAM of the anterior leaflet of the mital valve. The LV may be underfilled. Wall motion was normal; there were no regional wall motion abnormalities. The study is not technically sufficient to allow evaluation of LV diastolic function.    Neuro/Psych negative neurological ROS  negative psych ROS   GI/Hepatic negative GI ROS, Neg liver ROS,   Endo/Other  negative endocrine ROS  Renal/GU negative Renal ROS  negative genitourinary   Musculoskeletal negative musculoskeletal ROS (+)   Abdominal   Peds negative pediatric ROS (+)  Hematology  (+) Blood dyscrasia, anemia ,   Anesthesia Other Findings   Reproductive/Obstetrics negative OB ROS                         Anesthesia Physical Anesthesia Plan  ASA: III  Anesthesia Plan: General   Post-op Pain Management:    Induction: Intravenous  Airway Management Planned: Oral ETT  Additional Equipment:   Intra-op Plan:   Post-operative Plan: Extubation in OR  Informed Consent: I have reviewed the patients History and Physical, chart, labs and discussed the procedure including the risks, benefits and alternatives for the proposed  anesthesia with the patient or authorized representative who has indicated his/her understanding and acceptance.   Dental advisory given  Plan Discussed with: CRNA and Surgeon  Anesthesia Plan Comments:         Anesthesia Quick Evaluation

## 2012-09-23 NOTE — Progress Notes (Signed)
Patient ID: Tricia [name], female   DOB: 1925/12/16, 79 y.o.   MRN: [id number]  79 yo female with long spiral femur fracture, right Needs ORIF Admitted to medical service yesterday, stable for OR today NPO  Consent on chart for ORIF Reviewed findings and plans with patient and her son

## 2012-09-24 ENCOUNTER — Other Ambulatory Visit: Payer: Self-pay | Admitting: *Deleted

## 2012-09-24 DIAGNOSIS — W19XXXS Unspecified fall, sequela: Secondary | ICD-10-CM

## 2012-09-24 DIAGNOSIS — R404 Transient alteration of awareness: Secondary | ICD-10-CM

## 2012-09-24 LAB — CBC
MCH: 31.8 pg (ref 26.0–34.0)
MCHC: 35.9 g/dL (ref 30.0–36.0)
Platelets: 121 10*3/uL — ABNORMAL LOW (ref 150–400)

## 2012-09-24 LAB — TYPE AND SCREEN
ABO/RH(D): A POS
Unit division: 0
Unit division: 0

## 2012-09-24 LAB — BASIC METABOLIC PANEL
Calcium: 8.3 mg/dL — ABNORMAL LOW (ref 8.4–10.5)
Creatinine, Ser: 0.8 mg/dL (ref 0.50–1.10)
GFR calc non Af Amer: 66 mL/min — ABNORMAL LOW (ref >90)
Sodium: 131 mEq/L — ABNORMAL LOW (ref 135–145)

## 2012-09-24 MED ORDER — AMIODARONE HCL 200 MG PO TABS: 200.0000 mg | ORAL_TABLET | Freq: Every day | ORAL | Status: AC

## 2012-09-24 MED ORDER — ENOXAPARIN SODIUM 40 MG/0.4ML ~~LOC~~ SOLN: 40.0000 mg | SUBCUTANEOUS | Status: DC

## 2012-09-24 MED ORDER — HYDROCODONE-ACETAMINOPHEN 5-325 MG PO TABS: 1.0000 | ORAL_TABLET | Freq: Four times a day (QID) | ORAL | Status: DC | PRN

## 2012-09-24 MED ORDER — HALOPERIDOL LACTATE 5 MG/ML IJ SOLN: 1.0000 mg | Freq: Four times a day (QID) | INTRAMUSCULAR | Status: DC | PRN

## 2012-09-24 MED ORDER — HYDROCODONE-ACETAMINOPHEN 5-325 MG PO TABS: ORAL_TABLET | ORAL | Status: AC

## 2012-09-24 MED ORDER — SODIUM CHLORIDE 0.9 % IV BOLUS (SEPSIS)
500.0000 mL | Freq: Once | INTRAVENOUS | Status: AC
  Administered 2012-09-24: 500 mL via INTRAVENOUS

## 2012-09-24 MED ORDER — SODIUM CHLORIDE 0.9 % IV SOLN: INTRAVENOUS | Status: AC

## 2012-09-24 NOTE — Progress Notes (Signed)
Attempted to call report to [facility] no one answered.

## 2012-09-24 NOTE — Progress Notes (Signed)
Attempted to call report to [facility].  Was placed on hold for over 5 minutes.  Will attempt to call later time.

## 2012-09-24 NOTE — Progress Notes (Signed)
Attempted to call report no answer  When call is transferred to nurse.

## 2012-09-24 NOTE — Progress Notes (Signed)
   Subjective: 1 Day Post-Op Procedure(s) (LRB): OPEN REDUCTION INTERNAL FIXATION (ORIF) RIGHT DISTAL FEMUR FRACTURE (Right)   Patient expresses minimal pain. Resting comfortably in bed. Restraints are on the wrist with reports of being some what combative / agitated last night. Family is now in the room with her.  Objective:   VITALS:   Filed Vitals:   09/24/12 0600  BP: 90/55  Pulse: 79  Temp: 99.3 F (37.4 C)  Resp: 16    Dorsiflexion/Plantar flexion intact Incision: dressing C/D/I No cellulitis present Compartment soft  LABS  Recent Labs  09/21/12 2105 09/22/12 0220 09/24/12 0525  HGB 12.7 10.9* 8.9*  HCT 35.6* 31.9* 24.8*  WBC 10.8* 13.1* 13.0*  PLT 174 174 121*     Recent Labs  09/21/12 2105 09/22/12 0220 09/24/12 0525  NA 135 132* 131*  K 3.5 3.8 3.8  BUN 21 23 23   CREATININE 0.88 0.95 0.80  GLUCOSE 168* 168* 134*     Assessment/Plan: 1 Day Post-Op Procedure(s) (LRB): OPEN REDUCTION INTERNAL FIXATION (ORIF) RIGHT DISTAL FEMUR FRACTURE (Right)  Advance diet Up with therapy Discharge to SNF eventually when ready medically, orthopaedically stable. Family is planning short term facility and eventually long term assisted living. Follow up in 2 weeks at [facility]. Follow up with OLIN,Linh Hedberg D in 2 weeks.  Contact information:  [facility] [address] [place] [address] [phone]          Anastasio Auerbach. Calob Baskette   PAC  09/24/2012, 8:04 AM

## 2012-09-24 NOTE — Progress Notes (Signed)
[facility] PROGRESS NOTE   Assessment/Plan:  Femur fracture, right: - Ortho consulted. ORIF on 6.26.2014 - Cardiology consulted ok to proceed with surgery. - PT/INR <1.0. - PT consult pending.  Fall at home - mechanical.  Benign hypertensive heart disease without heart failure: - cont home meds.  IHSS (idiopathic hypertrophic subaortic stenosis) - consult cardiology    Code Status: full Family Communication: none  Disposition Plan: inpatient   Consultants:  Dr. Madelin Rear  cardiology  Procedures:  none  Antibiotics:  none   HPI/Subjective: Sedated, but no complains.  Objective: Filed Vitals:   09/23/12 2055 09/23/12 2204 09/23/12 2300 09/24/12 0600  BP: 129/79 132/76 125/72 90/55  Pulse: 82 92 91 79  Temp: 97.5 F (36.4 C) 97.7 F (36.5 C) 97.6 F (36.4 C) 99.3 F (37.4 C)  TempSrc: Axillary Oral Oral Oral  Resp: 16 16 16 16   Height:      Weight:      SpO2: 96% 96% 98% 100%    Intake/Output Summary (Last 24 hours) at 09/24/12 0742 Last data filed at 09/24/12 0655  Gross per 24 hour  Intake 3843.33 ml  Output   1175 ml  Net 2668.33 ml   Filed Weights   09/21/12 2337  Weight: 53.5 kg (117 lb 15.1 oz)    Exam:  General: Alert, awake, oriented x3, in no acute distress.  HEENT: No bruits, no goiter.  Heart: Regular rate and rhythm, + murmurs, no rubs, gallops.  Lungs: Good air movement, clear to ausultation.     Data Reviewed: Basic Metabolic Panel:  Recent Labs Lab 09/21/12 2105 09/22/12 0220 09/24/12 0525  NA 135 132* 131*  K 3.5 3.8 3.8  CL 97 98 98  CO2 25 28 27   GLUCOSE 168* 168* 134*  BUN 21 23 23   CREATININE 0.88 0.95 0.80  CALCIUM 8.8 8.6 8.3*   Liver Function Tests:  Recent Labs Lab 09/21/12 2105  AST 17  ALT 11  ALKPHOS 47  BILITOT 0.2*  PROT 6.4  ALBUMIN 3.4*   No results found for this basename: LIPASE, AMYLASE,  in the last 168 hours No results found for this basename: AMMONIA,  in the last  168 hours CBC:  Recent Labs Lab 09/21/12 2105 09/22/12 0220 09/24/12 0525  WBC 10.8* 13.1* 13.0*  NEUTROABS 9.4*  --   --   HGB 12.7 10.9* 8.9*  HCT 35.6* 31.9* 24.8*  MCV 90.6 89.6 88.6  PLT 174 174 121*   Cardiac Enzymes: No results found for this basename: CKTOTAL, CKMB, CKMBINDEX, TROPONINI,  in the last 168 hours BNP (last 3 results) No results found for this basename: PROBNP,  in the last 8760 hours CBG: No results found for this basename: GLUCAP,  in the last 168 hours  Recent Results (from the past 240 hour(s))  SURGICAL PCR SCREEN     Status: None   Collection Time    09/22/12  6:25 AM      Result Value Range Status   MRSA, PCR NEGATIVE  NEGATIVE Final   Staphylococcus aureus NEGATIVE  NEGATIVE Final   Comment:            The Xpert SA Assay (FDA     approved for NASAL specimens     in patients over 20 years of age),     is one component of     a comprehensive surveillance     program.  Test performance has     been validated by [employer]  [facility] for patients greater     than or equal to 62 year old.     It is not intended     to diagnose infection nor to     guide or monitor treatment.     Studies: Dg Femur Right  09/23/2012   *RADIOLOGY REPORT*  Clinical Data: Fracture fixation  DG C-ARM 1-60 MIN - NRPT [facility],RIGHT FEMUR - 2 VIEW  Comparison: 09/21/2012  Findings: Spiral fracture in the femur has been fixed with a locking intramedullary rod and cerclage wires.  Two  threaded screws extend into the femoral head.  The lesser trochanter is avulsed.  Fracture alignment appears satisfactory.  IMPRESSION: Orthopedic fixation of femoral fracture.   Original Report Authenticated By: Janeece Riggers, M.D.   Dg Pelvis Portable  09/23/2012   *RADIOLOGY REPORT*  Clinical Data: Postop ORIF of a right femur fracture  PORTABLE PELVIS  Comparison: 09/23/2012 at 1757 hours  Findings: Two views of the proximal mid right femur show a long intramedullary rod supporting to fixation  screws crossing the intertrochanteric right proximal femur fracture.  The major fracture fragments are in anatomic alignment.  The lesser trochanter fracture fragment is mildly displaced medially, by 5-6 mm.  The orthopedic hardware is well seated.  There is overlying soft tissue air and edema as well as lateral scans staples.  IMPRESSION: ORIF of a right proximal femur fracture.  The orthopedic hardware is well seated. Major fracture fragments are in anatomic alignment. No evidence of an operative complication.   Original Report Authenticated By: Amie Portland, M.D.   Dg C-arm 1-60 Min-no Report  09/23/2012   *RADIOLOGY REPORT*  Clinical Data: Fracture fixation  DG C-ARM 1-60 MIN - NRPT [facility],RIGHT FEMUR - 2 VIEW  Comparison: 09/21/2012  Findings: Spiral fracture in the femur has been fixed with a locking intramedullary rod and cerclage wires.  Two  threaded screws extend into the femoral head.  The lesser trochanter is avulsed.  Fracture alignment appears satisfactory.  IMPRESSION: Orthopedic fixation of femoral fracture.   Original Report Authenticated By: Janeece Riggers, M.D.    Scheduled Meds: . amiodarone  200 mg Oral Daily  . docusate sodium  100 mg Oral BID  . donepezil  5 mg Oral QHS  . enoxaparin (LOVENOX) injection  40 mg Subcutaneous Q24H  . ferrous sulfate  325 mg Oral TID PC  . letrozole  2.5 mg Oral Daily  . metoprolol succinate  50 mg Oral Daily  . sodium chloride  500 mL Intravenous Once   Continuous Infusions: . sodium chloride       Marinda Elk  [facility] Pager [phone]. If 8PM-8AM, please contact night-coverage at [email or web address], password [facility] 09/24/2012, 7:42 AM  LOS: 3 days

## 2012-09-24 NOTE — Progress Notes (Signed)
Patient ID: Tricia [name] MRN: [id number] DOB/AGE: 1926-03-27 79 y.o.  Admit date: 09/21/2012 Referring Physician: Robb Matar Primary Cardiologist: Patty Sermons Reason for Consultation: Pre-operative risk assessment  HPI: 79 yo female with history of hypertrophic subaortic stenosis, HTN, tobacco abuse, paroxysmal atrial fibrillation, breast cancer admitted after she tripped and fell at home and sustained a right femur fracture. Her cardiac issues are followed as an outpatient by Dr. Patty Sermons. Cardiology is asked to see for cardiac risk assessment prior to her planned surgical procedure tomorrow. Of note, she was admitted to [facility] February 2014 after a mechanical fall resulting in a significant laceration of her scalp requiring sutures. At the hospital she was found to be in atrial fibrillation with a rapid ventricular response. She had had a previous history of hypertensive heart disease without heart failure and she does have known IHSS. In the hospital in February she had problems with low blood pressure and bradycardia and she was taken off her previous beta blocker and placed on amiodarone 200 mg 3 times a day. On amiodarone she converted to normal sinus rhythm. The patient was also started on Xarelto in the hospital because of her atrial fibrillation and her IHSS. She was seen in the office by Dr. Patty Sermons 08/02/12 and at that time her amiodarone was lowered to 200 mg po once daily and Xarelto was continued for anticoagulation. She reports taking Xarelto every day up until today. (This is not included in her record of home medications as listed by pharmacy on admission but pt reports taking).   She tells me today that she has been feeling well. No chest pain, SOB, dizziness, near syncope or syncope. She is known to drink alcohol although it is not clear if this was related to her fall yesterday. She reports tripping on the carpet. No LOC or syncope. Severe right thigh pain post fall and  EMS called. She reports ongoing tobacco abuse.   Telemetry: NSR  Pt had ORIF of her right distal femur fracture yesterday.  Tolerated it without complications.   Past Medical History  Diagnosis Date  . Idiopathic hypertrophic subaortic stenosis   . Irregular heart beat   . Fibrocystic breast disease   . Tobacco abuse   . Hypertension   . History of colonoscopy   . Blood transfusion 10 yrs ago    after bleeding ulcer in stomach  . Heart murmur   . Cancer     left breast  . Atrial fibrillation     On chronic Xarelto    Family History  Problem Relation Age of Onset  . Heart attack Mother   . Breast cancer Mother   . Cancer Mother     breast  . Diabetes Father   . Stomach cancer Brother   . Cancer Brother     colon    History   Social History  . Marital Status: Married    Spouse Name: N/A    Number of Children: N/A  . Years of Education: N/A   Occupational History  . Not on file.   Social History Main Topics  . Smoking status: Current Every Day Smoker -- 1.00 packs/day for 20 years    Types: Cigarettes  . Smokeless tobacco: Never Used  . Alcohol Use: Yes     Comment: 1.5 oz of rum in afternoon  . Drug Use: No  . Sexually Active: Not on file   Other Topics Concern  . Not on file   Social History Narrative  .  No narrative on file    Past Surgical History:      . Appendectomy  1946  . Right breast benign lump removed  yrs ago  . Breast surgery  10/21/11    simple mastectomy    No Known Allergies  Prescriptions prior to admission  Medication Sig Dispense Refill  . calcium-vitamin D (OSCAL WITH D) 500-200 MG-UNIT per tablet Take 1 tablet by mouth every evening.      . donepezil (ARICEPT) 5 MG tablet Take 5 mg by mouth at bedtime.       Marland Kitchen glucosamine-chondroitin 500-400 MG tablet Take 1 tablet by mouth daily.       Marland Kitchen letrozole (FEMARA) 2.5 MG tablet Take 1 tablet (2.5 mg total) by mouth daily. Please make follow up appointment  90 tablet  0  . metoprolol  succinate (TOPROL-XL) 50 MG 24 hr tablet Take 1 tablet (50 mg total) by mouth daily. Take with or immediately following a meal.  30 tablet  5  . Multiple Vitamin (MULTIVITAMIN WITH MINERALS) TABS Take 1 tablet by mouth daily.       Current Meds:  . amiodarone  200 mg Oral Daily  . docusate sodium  100 mg Oral BID  . donepezil  5 mg Oral QHS  . enoxaparin (LOVENOX) injection  40 mg Subcutaneous Q24H  . ferrous sulfate  325 mg Oral TID PC  . letrozole  2.5 mg Oral Daily  . metoprolol succinate  50 mg Oral Daily      Physical Exam: Blood pressure 90/55, pulse 79, temperature 99.3 F (37.4 C), temperature source Oral, resp. rate 16, height 5' 5.5" (1.664 m), weight 117 lb 15.1 oz (53.5 kg), SpO2 100.00%.    General: Well developed, well nourished, NAD  HEENT: OP clear, mucus membranes moist  SKIN: warm, dry. No rashes.  Neuro: No focal deficits  Musculoskeletal: Muscle strength 5/5 all ext  Psychiatric: Mood and affect normal  Neck: No JVD, no carotid bruits, no thyromegaly, no lymphadenopathy.  Lungs:Clear bilaterally, no wheezes, rhonci, crackles  Cardiovascular: Regular rate and rhythm. Harsh systolic murmur noted. No gallops or rubs. S/p left mastectomy Abdomen:Soft. Bowel sounds present. Non-tender.  Extremities: No lower extremity edema.  Labs:   Lab Results  Component Value Date   WBC 13.0* 09/24/2012   HGB 8.9* 09/24/2012   HCT 24.8* 09/24/2012   MCV 88.6 09/24/2012   PLT 121* 09/24/2012     Recent Labs Lab 09/21/12 2105  09/24/12 0525  NA 135  < > 131*  K 3.5  < > 3.8  CL 97  < > 98  CO2 25  < > 27  BUN 21  < > 23  CREATININE 0.88  < > 0.80  CALCIUM 8.8  < > 8.3*  PROT 6.4  --   --   BILITOT 0.2*  --   --   ALKPHOS 47  --   --   ALT 11  --   --   AST 17  --   --   GLUCOSE 168*  < > 134*  < > = values in this interval not displayed.  EKG:  Tele - NSR  ASSESSMENT AND PLAN:   1. S/p  repair of right femur fracture 2. Paroxysmal atrial fibrillation - she  is in NSR this am 3. Idiopathic Hypertrophic subaortic stenosis   She seems to be stable from a cardiac standpoint. No recent chest pain, SOB or syncope.  We will sign off.  Call for  questions.  I have talked to the patient's son.  The plan is for her to go to rehab and then to assisted living.     Vesta Mixer, Montez Hageman., MD, [facility] 09/24/2012, 7:27 AM Office - [phone] Pager [phone]

## 2012-09-24 NOTE — Progress Notes (Signed)
Clinical Social Work Department CLINICAL SOCIAL WORK PLACEMENT NOTE 09/24/2012  Patient:  Tricia [name], Tricia [name]  Account Number:  [id number] Admit date:  09/21/2012  Clinical Social Worker:  Becky Sax, LCSW  Date/time:  09/24/2012 12:00 M  Clinical Social Work is seeking post-discharge placement for this patient at the following level of care:   SKILLED NURSING   (*CSW will update this form in Epic as items are completed)   09/24/2012  Patient/family provided with [facility] Department of Clinical Social Work's list of facilities offering this level of care within the geographic area requested by the patient (or if unable, by the patient's family).  09/24/2012  Patient/family informed of their freedom to choose among providers that offer the needed level of care, that participate in Medicare, Medicaid or managed care program needed by the patient, have an available bed and are willing to accept the patient.  09/24/2012  Patient/family informed of [facility]' ownership interest in [facility], as well as of the fact that they are under no obligation to receive care at this facility.  PASARR submitted to EDS on 09/24/2012 PASARR number received from EDS on 09/24/2012  FL2 transmitted to all facilities in geographic area requested by pt/family on  09/24/2012 FL2 transmitted to all facilities within larger geographic area on 09/24/2012  Patient informed that his/her managed care company has contracts with or will negotiate with  certain facilities, including the following:   t     Patient/family informed of bed offers received:  09/24/2012 Patient chooses bed at [facility] Physician recommends and patient chooses bed at    Patient to be transferred to [facility] on  09/24/2012 Patient to be transferred to facility by ptar  The following physician request were entered in Epic:   Additional Comments:

## 2012-09-24 NOTE — Progress Notes (Signed)
Pt d/c via with [facility].  No changes noted since am assessment.

## 2012-09-24 NOTE — Evaluation (Signed)
Physical Therapy Evaluation Patient Details Name: Tricia [name] MRN: [id number] DOB: November 12, 1925 Today's Date: 09/24/2012 Time: 1478-2956 PT Time Calculation (min): 26 min  PT Assessment / Plan / Recommendation History of Present Illness  79 y.o. female admitted to [facility] s/p fall with femur fx s/p ORIF.  Pt is PWB 50% with no other positional restrictions.    Clinical Impression  The pt is confused and lethargic post-op with decreased tolerance due to pain of mobility and walking.  Family is planning for SNF placement and at this time SNF placement for rehab is appropriate.      PT Assessment  Patient needs continued PT services    Follow Up Recommendations  SNF          Equipment Recommendations  Rolling walker with 5" wheels       Frequency Min 3X/week    Precautions / Restrictions Precautions Precautions: Fall Restrictions RLE Weight Bearing: Partial weight bearing RLE Partial Weight Bearing Percentage or Pounds: 50%   Pertinent Vitals/Pain See vitals flow sheet.       Mobility  Bed Mobility Bed Mobility: Supine to Sit;Sitting - Scoot to [place] of Bed;Sit to Supine Supine to Sit: 2: Max assist;With rails;HOB elevated Sitting - Scoot to [place] of Bed: 2: Max assist;With rail Sit to Supine: 2: Max assist;HOB flat Scooting to HOB: 1: +2 Total assist (with bed in trendelenberg) Scooting to [facility]: Patient Percentage: 0% Details for Bed Mobility Assistance: max assist to support trunk and legs to get to EOB.  Decreased initiation due to lethargy.  Transfers Transfers: Stand to Sit;Sit to Stand Sit to Stand: 3: Mod assist;From elevated surface;From bed Stand to Sit: 3: Mod assist;With upper extremity assist;To bed;To elevated surface Details for Transfer Assistance: Sit to stand with mod assist to support trunk over painful right leg. Verbal cues for sequence and technique.  Pt with bil hands on RW.   Ambulation/Gait Ambulation/Gait Assistance: Other (comment) (attempted,  but unable)    Exercises Total Joint Exercises Ankle Circles/Pumps: AROM;Both;10 reps;Supine Quad Sets: AROM;Right;5 reps;Supine Heel Slides: AAROM;Right;10 reps;Supine Hip ABduction/ADduction: AAROM;Right;5 reps;Supine   PT Diagnosis: Difficulty walking;Abnormality of gait;Generalized weakness;Acute pain;Altered mental status  PT Problem List: Decreased strength;Decreased range of motion;Decreased activity tolerance;Decreased balance;Decreased mobility;Decreased knowledge of use of DME;Decreased knowledge of precautions;Decreased skin integrity PT Treatment Interventions: DME instruction;Gait training;Therapeutic activities;Functional mobility training;Therapeutic exercise;Balance training;Neuromuscular re-education;Patient/family education;Modalities     PT Goals(Current goals can be found in the care plan section) Acute Rehab PT Goals Patient Stated Goal: to go back home PT Goal Formulation: With patient/family Time For Goal Achievement: 10/01/12 Potential to Achieve Goals: Good  Visit Information  Last PT Received On: 09/24/12 Assistance Needed: +2 (for gait) History of Present Illness: 79 y.o. female admitted to [facility] s/p fall with femur fx s/p ORIF.  Pt is PWB 50% with no other positional restrictions.         Prior Functioning  Home Living Family/patient expects to be discharged to:: Skilled nursing facility Living Arrangements: Children Additional Comments: Son lived with her, walked with cane intermittently.   Prior Function Level of Independence: Independent Comments: independent- driving, cooking, using cane Communication Communication: No difficulties Dominant Hand: Right    Cognition  Cognition Arousal/Alertness: Lethargic Behavior During Therapy: WFL for tasks assessed/performed Overall Cognitive Status: Difficult to assess Difficult to assess due to: Level of arousal    Extremity/Trunk Assessment Upper Extremity Assessment Upper Extremity Assessment:  Overall WFL for tasks assessed Lower Extremity Assessment Lower Extremity Assessment: RLE deficits/detail RLE  Deficits / Details: decreased due to pain and post-op status, ankle 3/5, knee 2-/5, hip 2/5 abduction, 2-/5 flexion.   LLE Deficits / Details: [facility]   Balance Static Sitting Balance Static Sitting - Balance Support: Bilateral upper extremity supported;Feet supported Static Sitting - Level of Assistance: 4: Min assist Static Sitting - Comment/# of Minutes: pt leaning far to the left due to pain with pressure on right hip Static Standing Balance Static Standing - Balance Support: Bilateral upper extremity supported Static Standing - Level of Assistance: 3: Mod assist Static Standing - Comment/# of Minutes: mod assist to support trunk over painful right leg.    End of Session PT - End of Session Activity Tolerance: Patient limited by fatigue;Patient limited by lethargy;Patient limited by pain Patient left: in bed;with call bell/phone within reach;with nursing/sitter in room;with family/visitor present    Lurena Joiner B. Andalyn Heckstall, PT, DPT [phone]   09/24/2012, 1:45 PM

## 2012-09-24 NOTE — Progress Notes (Signed)
Clinical Social Work Department BRIEF PSYCHOSOCIAL ASSESSMENT 09/24/2012  Patient:  Tricia [name], Tricia [name]     Account Number:  [id number]     Admit date:  09/21/2012  Clinical Social Worker:  Hattie Perch  Date/Time:  09/24/2012 12:00 M  Referred by:  Physician  Date Referred:  09/24/2012 Referred for  SNF Placement   Other Referral:   Interview type:  Family Other interview type:    PSYCHOSOCIAL DATA Living Status:  FAMILY Admitted from facility:   Level of care:   Primary support name:  Elyssa Pendelton Primary support relationship to patient:  CHILD, ADULT Degree of support available:   good    CURRENT CONCERNS Current Concerns  Post-Acute Placement   Other Concerns:    SOCIAL WORK ASSESSMENT / PLAN CSW met with patient and son at bedside. patient is pleasantly confused. patient in need of snf placement. son, carlton, is agreeable to same. he states [facility], [facility], and [facility] were recommended to him in no particular order. he is agreeable to same.   Assessment/plan status:   Other assessment/ plan:   Information/referral to community resources:    PATIENT'S/FAMILY'S RESPONSE TO PLAN OF CARE: son is agreeable to snf placement. he is concerned that patient may need alf following that.

## 2012-09-24 NOTE — Discharge Summary (Addendum)
Physician Discharge Summary  Tricia [name]:[id number] DOB: October 22, 1925 DOA: 09/21/2012  PCP: [name], MD  Admit date: 09/21/2012 Discharge date: 09/24/2012  Time spent: 35 minutes  Recommendations for Outpatient Follow-up:  1. Follow up with Ortho 2-4 weeks.  Discharge Diagnoses:  Principal Problem:   Femur fracture, right Active Problems:   IHSS (idiopathic hypertrophic subaortic stenosis)   Benign hypertensive heart disease without heart failure   Fall at home   Paroxysmal atrial fibrillation   Pre-operative cardiovascular examination   Discharge Condition: stable  Diet recommendation: heart healthy diet  Filed Weights   09/21/12 2337  Weight: 53.5 kg (117 lb 15.1 oz)    History of present illness:  79 yo female lives at home independently had a mechanical fall at home today without loc and hurt her left leg. No recent illnessess. Her right upper thigh is very painful which has been relieved some with iv narcotics in the ED. Her son lives with her at her house. Denies any n/v. No other active medical issues.   Hospital Course:  Femur fracture, right:  - Ortho consulted. ORIF on 6.26.2014  - Cardiology consulted ok to proceed with surgery.  - PT/INR <1.0.  - PT consult reccommended SNF.  Fall at home  - mechanical.   Benign hypertensive heart disease without heart failure:  - cont home meds.   A fib/IHSS (idiopathic hypertrophic subaortic stenosis)  - consult cardiology, cleared for surgery. - no events on telemetry.  Acute confusional state: - after procedure. Was monitor closely.  Procedures:  ORIF 6.26.2014  Consultations:  Cardiology  Orthopedic  Discharge Exam: Filed Vitals:   09/23/12 2055 09/23/12 2204 09/23/12 2300 09/24/12 0600  BP: 129/79 132/76 125/72 90/55  Pulse: 82 92 91 79  Temp: 97.5 F (36.4 C) 97.7 F (36.5 C) 97.6 F (36.4 C) 99.3 F (37.4 C)  TempSrc: Axillary Oral Oral Oral  Resp: 16 16 16 16   Height:       Weight:      SpO2: 96% 96% 98% 100%    General: A&O x2 Cardiovascular: RRR Respiratory: good air movement  Discharge Instructions  Discharge Orders   Future Appointments Provider Department Dept Phone   09/30/2012 11:30 AM Ernestene Mention, MD [facility], [place] [phone]   11/01/2012 10:15 AM [name], MD  [facility] Main Office [place]) [phone]   Future Orders Complete By Expires     Diet - low sodium heart healthy  As directed     Increase activity slowly  As directed         Medication List    TAKE these medications       amiodarone 200 MG tablet  Commonly known as:  PACERONE  Take 1 tablet (200 mg total) by mouth daily.     calcium-vitamin D 500-200 MG-UNIT per tablet  Commonly known as:  OSCAL WITH D  Take 1 tablet by mouth every evening.     donepezil 5 MG tablet  Commonly known as:  ARICEPT  Take 5 mg by mouth at bedtime.     enoxaparin 40 MG/0.4ML injection  Commonly known as:  LOVENOX  Inject 0.4 mLs (40 mg total) into the skin daily.     glucosamine-chondroitin 500-400 MG tablet  Take 1 tablet by mouth daily.     HYDROcodone-acetaminophen 5-325 MG per tablet  Commonly known as:  NORCO/VICODIN  Take 1-2 tablets by mouth every 6 (six) hours as needed.     letrozole 2.5 MG tablet  Commonly known as:  FEMARA  Take 1 tablet (2.5 mg total) by mouth daily. Please make follow up appointment     metoprolol succinate 50 MG 24 hr tablet  Commonly known as:  TOPROL-XL  Take 1 tablet (50 mg total) by mouth daily. Take with or immediately following a meal.     multivitamin with minerals Tabs  Take 1 tablet by mouth daily.       No Known Allergies     Follow-up Information   Follow up with Shelda Pal, MD In 2 weeks. (hospital follow up)    Contact information:   [address] [place] [address] [phone]        The results of significant diagnostics from this  hospitalization (including imaging, microbiology, ancillary and laboratory) are listed below for reference.    Significant Diagnostic Studies: Dg Hip Complete Right  09/21/2012   *RADIOLOGY REPORT*  Clinical Data: Right hip pain secondary to a fall.  RIGHT HIP - COMPLETE 2+ VIEW  Comparison: None.  Findings: There is a comminuted spiral fracture of the proximal femoral shaft extending 25 cm distally.  The fracture does include avulsion of the lesser trochanter.  Fracture does not appear to extend into the greater trochanter.  IMPRESSION: Spiral fracture of the proximal femoral shaft with angulation and displacement.   Original Report Authenticated By: Francene Boyers, M.D.   Dg Femur Right  09/23/2012   *RADIOLOGY REPORT*  Clinical Data: Fracture fixation  DG C-ARM 1-60 MIN - NRPT [facility],RIGHT FEMUR - 2 VIEW  Comparison: 09/21/2012  Findings: Spiral fracture in the femur has been fixed with a locking intramedullary rod and cerclage wires.  Two  threaded screws extend into the femoral head.  The lesser trochanter is avulsed.  Fracture alignment appears satisfactory.  IMPRESSION: Orthopedic fixation of femoral fracture.   Original Report Authenticated By: Janeece Riggers, M.D.   Dg Femur Right  09/21/2012   *RADIOLOGY REPORT*  Clinical Data: Right femur pain secondary to a fall.  RIGHT FEMUR - 2 VIEW  Comparison: None.  Findings: There is an angulated displaced comminuted spiral fracture of the proximal right femoral shaft extending 25 cm distal to the femoral neck.  The lesser trochanter is avulsed.  There is diffuse osteopenia.  Distal femur is intact.  IMPRESSION: Fracture of the proximal right femoral shaft.   Original Report Authenticated By: Francene Boyers, M.D.   Dg Pelvis Portable  09/23/2012   *RADIOLOGY REPORT*  Clinical Data: Postop ORIF of a right femur fracture  PORTABLE PELVIS  Comparison: 09/23/2012 at 1757 hours  Findings: Two views of the proximal mid right femur show a long intramedullary rod  supporting to fixation screws crossing the intertrochanteric right proximal femur fracture.  The major fracture fragments are in anatomic alignment.  The lesser trochanter fracture fragment is mildly displaced medially, by 5-6 mm.  The orthopedic hardware is well seated.  There is overlying soft tissue air and edema as well as lateral scans staples.  IMPRESSION: ORIF of a right proximal femur fracture.  The orthopedic hardware is well seated. Major fracture fragments are in anatomic alignment. No evidence of an operative complication.   Original Report Authenticated By: Amie Portland, M.D.   Dg C-arm 1-60 Min-no Report  09/23/2012   *RADIOLOGY REPORT*  Clinical Data: Fracture fixation  DG C-ARM 1-60 MIN - NRPT [facility],RIGHT FEMUR - 2 VIEW  Comparison: 09/21/2012  Findings: Spiral fracture in the femur has been fixed with a  locking intramedullary rod and cerclage wires.  Two  threaded screws extend into the femoral head.  The lesser trochanter is avulsed.  Fracture alignment appears satisfactory.  IMPRESSION: Orthopedic fixation of femoral fracture.   Original Report Authenticated By: Janeece Riggers, M.D.    Microbiology: Recent Results (from the past 240 hour(s))  SURGICAL PCR SCREEN     Status: None   Collection Time    09/22/12  6:25 AM      Result Value Range Status   MRSA, PCR NEGATIVE  NEGATIVE Final   Staphylococcus aureus NEGATIVE  NEGATIVE Final   Comment:            The Xpert SA Assay (FDA     approved for NASAL specimens     in patients over 21 years of age),     is one component of     a comprehensive surveillance     program.  Test performance has     been validated by [employer] for patients greater     than or equal to 53 year old.     It is not intended     to diagnose infection nor to     guide or monitor treatment.     Labs: Basic Metabolic Panel:  Recent Labs Lab 09/21/12 2105 09/22/12 0220 09/24/12 0525  NA 135 132* 131*  K 3.5 3.8 3.8  CL 97 98 98  CO2 25 28  27   GLUCOSE 168* 168* 134*  BUN 21 23 23   CREATININE 0.88 0.95 0.80  CALCIUM 8.8 8.6 8.3*   Liver Function Tests:  Recent Labs Lab 09/21/12 2105  AST 17  ALT 11  ALKPHOS 47  BILITOT 0.2*  PROT 6.4  ALBUMIN 3.4*   No results found for this basename: LIPASE, AMYLASE,  in the last 168 hours No results found for this basename: AMMONIA,  in the last 168 hours CBC:  Recent Labs Lab 09/21/12 2105 09/22/12 0220 09/24/12 0525  WBC 10.8* 13.1* 13.0*  NEUTROABS 9.4*  --   --   HGB 12.7 10.9* 8.9*  HCT 35.6* 31.9* 24.8*  MCV 90.6 89.6 88.6  PLT 174 174 121*   Cardiac Enzymes: No results found for this basename: CKTOTAL, CKMB, CKMBINDEX, TROPONINI,  in the last 168 hours BNP: BNP (last 3 results) No results found for this basename: PROBNP,  in the last 8760 hours CBG: No results found for this basename: GLUCAP,  in the last 168 hours     Signed:  Marinda Elk  [facility] 09/24/2012, 7:50 AM

## 2012-09-26 NOTE — Op Note (Signed)
Tricia [name], BOEHM NO.:  [id number]  MEDICAL RECORD NO.:  [id number]  LOCATION:  1501                         FACILITY:  [facility]  PHYSICIAN:  Madlyn Frankel. Charlann Boxer, M.D.  DATE OF BIRTH:  02-05-1926  DATE OF PROCEDURE:  09/23/2012 DATE OF DISCHARGE:  09/24/2012                              OPERATIVE REPORT   PREOPERATIVE DIAGNOSIS:  Comminuted spiral proximal mid shaft femur fracture involving the lesser trochanter proximally.  POSTOPERATIVE DIAGNOSIS:  Comminuted spiral proximal mid shaft femur fracture involving the lesser trochanter proximally.  PROCEDURE:  Open reduction and internal fixation of right femur fracture using a combination of open techniques with 16-gauge wires from prior fracture and subsequent intramedullary nailing with Biomet trochanteric VersaNail 11 x 380 mm with proximal interlocks into the femoral neck and head and one distal through perfect surgical technique.  SURGEON:  Shelda Pal, MD.  ASSISTANT:  Lanney Gins, PA-C.  Note that Mr. Carmon Sails was present for the entirety of the case from preoperative position, perioperative management of the extremity, assistance with reduction and maintenance reduction while performing intramedullary nailing as well as primary wound closure general facilitation of the case.  ANESTHESIA:  General.  SPECIMENS:  None.  COMPLICATION:  None.  BLOOD LOSS:  Probably around 600 mL.  FLUIDS:  The patient did receive 2 units of packed red blood cells due to a preoperative low hemoglobin as well as blood loss from surgery.  TOURNIQUET:  Not utilized.  DRAINS:  None.  INDICATIONS FOR PROCEDURE:  This patient in an 79 year old female, who had a ground level fall.  She was seen and evaluated and was noted to have a spiral fracture of the mid shaft of the femur.  I was asked by one of my partners who has performed initial consultation to assist with definitive management.  I had a chance review with  the patient and her son who came up from [place].  The injury findings as well as the operative plan and postoperative expectations, consent was obtained to benefit and management of fracture care.  Risks of nonunion discussed. The risks of potential surgery reviewed in hopes of avoiding this.  PROCEDURE IN DETAIL:  The patient was brought to operative theater. Once adequate anesthesia was established, preoperative antibiotics, Ancef administered.  She was positioned supine on the fracture table with the left leg flexed and abducted out of the way.  The right leg placed in traction boot.  Once all bony prominences were padded and adequate position was carried out in routine positioning, traction was applied to the foot under fluoroscopic imaging to confirm length of the fracture site.  Once we did this, we then prepped and draped the lateral aspect of the hip from the iliac crest to below the knee, now with a shower curtain technique.  A time-out was performed identifying the patient, plan, procedure, and extremity.  Fluoroscopy was brought back in the field identified the fracture location from a distal extent of the proximal as well as the location of the tip of the trochanter.  I first made a lateral incision over the extent of her fracture site, elevated the vastus lateralis anteriorly based on fracture pattern  destruction and injury to the muscle there from fracture.  At this point, I was able to manually reduce the fracture, confirmed radiographically.  I then passed a 16-gauge wire proximal on the proximal aspect of the fracture and then one distal tightening this down by twisting the 16-gauge wire, and providing a near-anatomic reduction of this long spiral segment of the fracture.  Once this was confirmed radiographically attention was now directed with intramedullary nailing of the femur.  At this point, incision was made proximal.  A guidewire was inserted into the tip of  the trochanter and confirmed radiographically in AP and lateral plane.  I then opened up the proximal femur with the starting drill, passed a ball-tipped guidewire, past the fracture site to the knee.  I selected it based on measurement of 380 mm nail.  I then began reaming with a 10-mm reamer, reamed up to a 12.5-mm selecting the 11-mm nail.  The size 11 x 380 mm right-sided trochanteric bursa nail was then opened, placed onto the insertion jig and then passed by hand over the ball-tip guidewire, passed the fracture site to the distal aspect of the femur.  Once an appropriate depth and position, I confirmed location by placing guidewires into the femoral neck and head.  Once confirmed, we drilled and placed two cancellous screws into the femoral head and neck area for proximal fixation.  The insertion jig was then removed, and then under perfect circle technique with traction off the leg and the leg abducted appropriately, I placed a single distal interlock from lateral to medial.  At this point, all radiographs were obtained to confirm reduction.  I clipped the 16-gauge wire and then twisted it down to bone.  All wounds were irrigated with normal saline solution.  The lateral wound was closed utilizing the vastus lateralis to close over top of the vastus lateralis fascia.  The gluteus medius fascia was reapproximated using #1 Vicryl as well.  The remaining wounds were closed with 2-0 Vicryl and staples on the skin.  Based on the extent and the thigh was then cleaned, dried, dressed, sterilely using Mepilex dressing.  She was brought to the recovery room, extubated in stable condition tolerating the procedure well.  Findings reviewed with her son including need for blood transfusion as per anesthetic recommendation based on her underlying medical comorbidities in this patient.  I will make her partial weightbearing.  She will follow up in the office in 2-3 weeks for wound evaluation and  radiographs at that time.     Madlyn Frankel Charlann Boxer, M.D.     MDO/MEDQ  D:  09/26/2012  T:  09/26/2012  Job:  [id number]

## 2012-09-26 NOTE — Brief Op Note (Signed)
09/21/2012 - 09/23/2012  10:28 AM  PATIENT:  Tricia [name]  79 y.o. female  PRE-OPERATIVE DIAGNOSIS:  Right Femur Fracture  POST-OPERATIVE DIAGNOSIS:  Right Femur Fracture  PROCEDURE:  Procedure(s): OPEN REDUCTION INTERNAL FIXATION (ORIF) RIGHT DISTAL FEMUR FRACTURE (Right)  SURGEON:  Surgeon(s) and Role:    * Shelda Pal, MD - Primary  PHYSICIAN ASSISTANT: Lanney Gins, PA-C  ANESTHESIA:   general  EBL:  Total I/O In: 500 [I.V.:500] Out: -   BLOOD ADMINISTERED:2 units PRBCs   DRAINS: none   LOCAL MEDICATIONS USED:  NONE  SPECIMEN:  No Specimen  DISPOSITION OF SPECIMEN:  N/A  COUNTS:  YES  TOURNIQUET:  * No tourniquets in log *  DICTATION: .Other Dictation: Dictation Number [id number]  PLAN OF CARE: Admit to inpatient   PATIENT DISPOSITION:  PACU - hemodynamically stable.   Delay start of Pharmacological VTE agent (>24hrs) due to surgical blood loss or risk of bleeding: no

## 2012-09-27 ENCOUNTER — Non-Acute Institutional Stay (SKILLED_NURSING_FACILITY): Payer: Medicare Other | Admitting: Adult Health

## 2012-09-27 ENCOUNTER — Encounter (HOSPITAL_COMMUNITY): Payer: Self-pay | Admitting: Orthopedic Surgery

## 2012-09-27 DIAGNOSIS — F0280 Dementia in other diseases classified elsewhere without behavioral disturbance: Secondary | ICD-10-CM | POA: Insufficient documentation

## 2012-09-27 DIAGNOSIS — G309 Alzheimer's disease, unspecified: Secondary | ICD-10-CM

## 2012-09-27 DIAGNOSIS — I4891 Unspecified atrial fibrillation: Secondary | ICD-10-CM

## 2012-09-27 DIAGNOSIS — I480 Paroxysmal atrial fibrillation: Secondary | ICD-10-CM

## 2012-09-27 DIAGNOSIS — S7291XF Unspecified fracture of right femur, subsequent encounter for open fracture type IIIA, IIIB, or IIIC with routine healing: Secondary | ICD-10-CM

## 2012-09-27 DIAGNOSIS — R52 Pain, unspecified: Secondary | ICD-10-CM

## 2012-09-27 DIAGNOSIS — S7290XD Unspecified fracture of unspecified femur, subsequent encounter for closed fracture with routine healing: Secondary | ICD-10-CM

## 2012-09-27 DIAGNOSIS — I119 Hypertensive heart disease without heart failure: Secondary | ICD-10-CM

## 2012-09-27 NOTE — Progress Notes (Signed)
  Subjective:    Patient ID: Tricia [name], female    DOB: 1925-10-06, 79 y.o.   MRN: [id number]  HPI This is an 79 year old female who has been admitted to [facility] on 09/24/12 from [facility]. She fell at home sustaining a right femur fracture S/P ORIF. She has been admitted for a short-term rehabilitation.   Review of Systems  Constitutional: Negative.   HENT: Negative.   Eyes: Negative.   Respiratory: Negative for cough and shortness of breath.   Cardiovascular: Negative for palpitations and leg swelling.  Gastrointestinal: Negative.   Endocrine: Negative.   Genitourinary: Negative.   Neurological: Negative.   Hematological: Negative for adenopathy. Does not bruise/bleed easily.  Psychiatric/Behavioral: Negative.        Objective:   Physical Exam  Nursing note and vitals reviewed. Constitutional: She is oriented to person, place, and time. She appears well-developed and well-nourished.  HENT:  Head: Normocephalic and atraumatic.  Right Ear: External ear normal.  Left Ear: External ear normal.  Nose: Nose normal.  Mouth/Throat: Oropharynx is clear and moist.  Eyes: Conjunctivae and EOM are normal. Pupils are equal, round, and reactive to light.  Neck: Normal range of motion. Neck supple.  Cardiovascular: Normal rate.   Pulmonary/Chest: Effort normal and breath sounds normal. She has no wheezes. She has no rales.  Abdominal: Soft. Bowel sounds are normal. She exhibits no distension.  Musculoskeletal: She exhibits no edema and no tenderness.  Neurological: She is alert and oriented to person, place, and time. She has normal reflexes.  Skin: Skin is warm and dry.  Psychiatric: She has a normal mood and affect. Her behavior is normal. Judgment and thought content normal.    LABS: 09/24/12  NA 131  K 3.8  Glucose 134  BUN 23  Creatinine 0.80  Wbc 13.0  hgb 8.9  hct 8.9   Medications reviewed per [facility]      Assessment & Plan:   Paroxysmal atrial  fibrillation - stable; currently on Lovenox; follow-up with cardiologist re: long-term anticoagulant  Femur fracture, right S/P ORIF - for PT and OT  Benign hypertensive heart disease without heart failure - stable  Alzheimer's Dementia - stable  Pain - stable; decrease Norco 5/325 mg 1 tab PO Q D

## 2012-09-30 ENCOUNTER — Ambulatory Visit (INDEPENDENT_AMBULATORY_CARE_PROVIDER_SITE_OTHER): Payer: Medicare Other | Admitting: General Surgery

## 2012-09-30 ENCOUNTER — Telehealth (INDEPENDENT_AMBULATORY_CARE_PROVIDER_SITE_OTHER): Payer: Self-pay | Admitting: General Surgery

## 2012-09-30 ENCOUNTER — Encounter (INDEPENDENT_AMBULATORY_CARE_PROVIDER_SITE_OTHER): Payer: Self-pay | Admitting: General Surgery

## 2012-09-30 VITALS — BP 111/69 | HR 60 | Temp 98.1°F | Resp 11

## 2012-09-30 DIAGNOSIS — C50419 Malignant neoplasm of upper-outer quadrant of unspecified female breast: Secondary | ICD-10-CM

## 2012-09-30 DIAGNOSIS — C50412 Malignant neoplasm of upper-outer quadrant of left female breast: Secondary | ICD-10-CM

## 2012-09-30 NOTE — Patient Instructions (Addendum)
Your left mastectomy wound looks good. Your right breast exam is normal. Your lymph nodes are not enlarged. There is no evidence of cancer.  You are due for a right breast mammogram and we will schedule that for you.  We will help to get reassigned to a medical oncologist at the cancer center.  Continue to take the femara  medication. That will lower the risk of getting another cancer.  Return to see Dr. Derrell Lolling in one year.

## 2012-09-30 NOTE — Progress Notes (Signed)
Patient ID: Tricia [name], female   DOB: October 14, 1925, 79 y.o.   MRN: [id number] History: this patient returns for long-term followup regarding her left breast cancer. She lost her husband since I last saw her. She underwent left total mastectomy on 10/21/2011. She had received several months of neoadjuvant antiestrogen therapy. Final pathology shows 3 separate foci of invasive ductal carcinoma, largest focus 1.1 cm, margins are clear. ER 99%, PR 8%, HER-2-negative, Ki-67 9%. Final stage by ypT1c, ypNx.Marland Kitchen  She had been on neoadjuvant antiestrogen therapy for several months. She had a difficult time making decisions as did her family due to incomplete insight. She has memory problems as well. Her sons have been helping the process along.  She continues to smoke, which I discouraged.  She has no complaints about her left mastectomy wound.  She remains on Femara, but has not been reassigned to a medical oncologist, according to her and her son. She recently sustained a femur fracture and that has been repaired. She is living at home with her son. She is due for a mammogram  Exam: Patient is elderly, a little bit deconditioned. Alert. In no distress. In a wheelchair. Neck no adenopathy or mass Lungs clear to auscultation bilaterally Heart regular rate and rhythm. No ectopy Breasts left mastectomy wound well healed. Skin healthy. No nodules or ulcerations or adenopathy. Range of motion left shoulder is 100%. Right breast is atrophic. A little bit lumpy. The skin change. No dominant mass. No adenopathy  Assessment: Multifocal invasive ductal carcinoma left breast, ER positive, HER-2-negative, final pathologic stage ypT1c, ypNx. No evidence of recovery 1 year following left total mastectomy Paroxysmal atrial fibrillation, on beta blockers Dementia, on Aricept,  mild IHSS, followed by Dr. Patty Sermons History NSTEMI Recent femur fracture  Plan: She was reassured as was her son Continue Femara Scheduled  for a right breast mammogram now, and repeat right breast mammogram in one year We will call the [facility] and get her reassigned to one of the breast oncologists to follow her while she is on antiestrogen therapy Return to see me in one year.   Angelia Mould. Derrell Lolling, M.D., [facility] General and Minimally invasive Surgery Breast and Colorectal Surgery Office:   [phone] Pager:   [phone]

## 2012-09-30 NOTE — Telephone Encounter (Signed)
Spoke with West Carbo at [facility] [phone] she is aware that this patient has appt at  [facility]  10/07/12 at 930am

## 2012-10-04 ENCOUNTER — Telehealth (INDEPENDENT_AMBULATORY_CARE_PROVIDER_SITE_OTHER): Payer: Self-pay

## 2012-10-04 ENCOUNTER — Telehealth: Payer: Self-pay | Admitting: *Deleted

## 2012-10-04 NOTE — Telephone Encounter (Signed)
Pt is currently at [facility] and I received a request from Huntley Dec at [facility] to call and get her reschedule w/ a new provider since Dr. Donnie Coffin is no longer here.  Called an spoke w/ pts nurse Manetra at [facility] and confirmed 10/18/12 appt w/ her.  Emailed Huntley Dec at [facility] to make her aware.

## 2012-10-04 NOTE — Telephone Encounter (Signed)
Erroneous encounter

## 2012-10-07 ENCOUNTER — Non-Acute Institutional Stay (SKILLED_NURSING_FACILITY): Payer: Medicare Other | Admitting: Internal Medicine

## 2012-10-07 DIAGNOSIS — S8290XS Unspecified fracture of unspecified lower leg, sequela: Secondary | ICD-10-CM

## 2012-10-07 DIAGNOSIS — I119 Hypertensive heart disease without heart failure: Secondary | ICD-10-CM

## 2012-10-07 DIAGNOSIS — I4891 Unspecified atrial fibrillation: Secondary | ICD-10-CM

## 2012-10-07 DIAGNOSIS — S7291XS Unspecified fracture of right femur, sequela: Secondary | ICD-10-CM

## 2012-10-07 DIAGNOSIS — D62 Acute posthemorrhagic anemia: Secondary | ICD-10-CM

## 2012-10-07 DIAGNOSIS — I480 Paroxysmal atrial fibrillation: Secondary | ICD-10-CM

## 2012-10-18 ENCOUNTER — Telehealth: Payer: Self-pay | Admitting: *Deleted

## 2012-10-18 ENCOUNTER — Ambulatory Visit (HOSPITAL_BASED_OUTPATIENT_CLINIC_OR_DEPARTMENT_OTHER): Payer: Medicare Other | Admitting: Family

## 2012-10-18 ENCOUNTER — Encounter: Payer: Self-pay | Admitting: Family

## 2012-10-18 ENCOUNTER — Encounter: Payer: Self-pay | Admitting: Nurse Practitioner

## 2012-10-18 ENCOUNTER — Ambulatory Visit (INDEPENDENT_AMBULATORY_CARE_PROVIDER_SITE_OTHER): Payer: Medicare Other | Admitting: Nurse Practitioner

## 2012-10-18 VITALS — BP 106/63 | HR 50 | Temp 98.4°F | Resp 20

## 2012-10-18 VITALS — BP 130/60 | HR 54 | Ht 63.0 in | Wt 114.8 lb

## 2012-10-18 DIAGNOSIS — C50912 Malignant neoplasm of unspecified site of left female breast: Secondary | ICD-10-CM

## 2012-10-18 DIAGNOSIS — C50419 Malignant neoplasm of upper-outer quadrant of unspecified female breast: Secondary | ICD-10-CM

## 2012-10-18 DIAGNOSIS — I4891 Unspecified atrial fibrillation: Secondary | ICD-10-CM

## 2012-10-18 DIAGNOSIS — M810 Age-related osteoporosis without current pathological fracture: Secondary | ICD-10-CM

## 2012-10-18 DIAGNOSIS — Z170 Estrogen receptor positive status [ER+]: Secondary | ICD-10-CM

## 2012-10-18 NOTE — Progress Notes (Addendum)
[facility]  [phone] [phone]  OFFICE PROGRESS NOTE   ID: LINNET BOTTARI   DOB: 12-12-1925  MR#: [id number]  XLK#:[id number]   PCP: [name], MD GYN:  SU: Claud Kelp, M.D. OTHER MD:   HISTORY OF PRESENT ILLNESS: From Dr. Theron Arista Rubin's new patient evaluation note dated 11/13/2010: "This is a delightful 79 year old woman from [facility] referred for evaluation and treatment of her recently diagnosed breast cancer.  She undergoes screening mammography.  She did detect something in the medial portion of her left breast about a year ago.  She did have a mammogram performed in July 2009.  A potential abnormality was seen in the left breast.  Further evaluation was recommended.  Followup mammogram was performed in August 2011.  Calcifications were again noted.  They did not appear pleomorphic, likely benign, and followup was recommended in 6 months.  A followup mammogram in February 2012 suggested that this area measured 6 mm and was stable, so a followup mammogram was recommended in August 2012.  In August 2012, she returned for a mammogram, and at that point there were calcifications seen in the upper outer quadrant which were felt to be benign.  In the medial portion of the left breast 3 cm from the nipple, there was a suspicious 5 mm rounded mass with irregular borders.  Ultrasound confirmed the presence of a 5 mm hypoechoic mass.  No mention was made of any physical exam findings. Ultimately, the patient had an area in the medial portion of the breast biopsied, as well as in the upper outer quadrant.  The distance between these areas measured about 11 cm.  The area at 2 o'clock was an invasive ductal cancer grade 2, ER and PR positive, HER2 negative.  The area in the medial portion of the breast was low-grade invasive ductal cancer, again ER/PR positive, HER2 negative."  Her subsequent history is as detailed below.   INTERVAL HISTORY: Dr. Darnelle Catalan  and I saw Ms. DOMINIQUA COONER today for followup of invasive ductal carcinoma of the left breast.  She is accompanied for today's office visit by her son Alfredo Bach.  The patient was last seen by Dr. Donnie Coffin on 03/30/2012.  Since her last office visit, her interval history is significant for the patient having sustained a fall at a local restaurant in which she suffered a comminuted spiral fracture of the proximal right femur and is status post ORIF of right femur as of 09/26/2012 by Dr. Durene Romans.  Her interval history is also significant for becoming a widow with the passing of her husband Tran Randle in 03/2012.  She is establishing herself with Dr. Darrall Dears service today.   REVIEW OF SYSTEMS: A 10 point review of systems was completed and is negative except her interval history is significant for right proximal femur fracture status post ORIF on 09/26/2012.  She is currently at [facility] undergoing rehabilitation for the femur fracture.  The patient was a everyday smoker until being placed at [facility] for rehabilitation.  Continuing smoking cessation was discussed with her.  She denies any symptomatology including pain and states "I feel pretty good most of the time."   PAST MEDICAL HISTORY: Past Medical History  Diagnosis Date  . Idiopathic hypertrophic subaortic stenosis   . Irregular heart beat   . Fibrocystic breast disease   . Tobacco abuse   . Hypertension   . History of colonoscopy   . Blood transfusion 10 yrs ago  after bleeding ulcer in stomach  . Heart murmur   . Cancer     left breast  . Atrial fibrillation     On chronic Xarelto  . Femur fracture, right 08/2012  She had a history of fainting spells prior to being diagnosed with atrial fibrillation.   PAST SURGICAL HISTORY: Past Surgical History  Procedure Laterality Date  . US echocardiography  12/05/2008    EF 60-65%  . US echocardiography  12/01/2007    EF 60-65%  . US echocardiography  07/01/2006    EF 55-60%  .  US echocardiography  06/26/2005    EF 55-60%  . Cardiac catheterization  01/30/2002    EF 75-80%  . Appendectomy  1946  . Right breast benign lump removed  yrs ago  . Breast surgery  10/21/11    simple mastectomy  . Orif femur fracture Right 09/23/2012    Procedure: OPEN REDUCTION INTERNAL FIXATION (ORIF) RIGHT DISTAL FEMUR FRACTURE;  Surgeon: Shelda Pal, MD;  Location: [facility];  Service: Orthopedics;  Laterality: Right;    FAMILY HISTORY Family History  Problem Relation Age of Onset  . Heart attack Mother   . Breast cancer Mother   . Cancer Mother     breast  . Diabetes Father   . Stomach cancer Brother   . Cancer Brother     colon  Both parents deceased.  She has 3 brothers, 2 sisters.   Her mother did have breast cancer at 75, died at 37.  No other history of breast or ovarian cancer in her family.   GYNECOLOGIC HISTORY: She is G2, P2.  Menarche at age 24.  Age of parity 4.   Menopause in her 33s.  She did take birth control pills but not hormone replacement therapy.   SOCIAL HISTORY: Ms. Viglione recently became a widow with the passing of her husband Yurika Pereda in 03/2012.  They were married for 67 years.  She is from [place].  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  She has 2 sons; Cecil in [place], and Baldo Ash (her healthcare power of attorney) in [place].   She has one grandchild and one great-grandchild.  In her spare time she enjoys reading.  She previously enjoyed swimming and water skiing until she was in her 19s.  ADVANCED DIRECTIVES: In place.  The patient's son, Nahlia Hellmann lives in [place], [place] and is her power of attorney.  He may be reached at [phone].   HEALTH MAINTENANCE: History  Substance Use Topics  . Smoking status: Former Smoker -- 1.00 packs/day for 74 years    Types: Cigarettes    Quit date: 09/21/2012  . Smokeless tobacco: Never Used  . Alcohol Use:  No    Colonoscopy: Not on file PAP: Not on file Bone density:  The patient's last bone density scan on 11/19/2010 showed a T score of -2.5 (osteoporosis). Lipid panel:  05/16/2012   No Known Allergies  Current Outpatient Prescriptions  Medication Sig Dispense Refill  . amiodarone (PACERONE) 200 MG tablet Take 1 tablet (200 mg total) by mouth daily.      . calcium-vitamin D (OSCAL WITH D) 500-200 MG-UNIT per tablet Take 1 tablet by mouth every evening.      . donepezil (ARICEPT) 5 MG tablet Take 5 mg by mouth at bedtime.       . enoxaparin (LOVENOX) 40 MG/0.4ML injection Inject 0.4 mLs (40  mg total) into the skin daily.  14 Syringe  0  . glucosamine-chondroitin 500-400 MG tablet Take 1 tablet by mouth daily.       Marland Kitchen HYDROcodone-acetaminophen (NORCO/VICODIN) 5-325 MG per tablet Take one tablet by mouth every eight hours; Take one tablet by mouth every 6 hours as needed for pain  210 tablet  0  . letrozole (FEMARA) 2.5 MG tablet Take 1 tablet (2.5 mg total) by mouth daily. Please make follow up appointment  90 tablet  0  . metoprolol succinate (TOPROL-XL) 50 MG 24 hr tablet Take 1 tablet (50 mg total) by mouth daily. Take with or immediately following a meal.  30 tablet  5  . Multiple Vitamin (MULTIVITAMIN WITH MINERALS) TABS Take 1 tablet by mouth daily.       No current facility-administered medications for this visit.    OBJECTIVE: Elderly Caucasian woman in a wheelchair. Filed Vitals:   10/18/12 1103  BP: 106/63  Pulse: 50  Temp: 98.4 F (36.9 C)  Resp: 20     There is no weight on file to calculate BMI.      ECOG FS: 0 - Asymptomatic  General appearance: Alert, cooperative, thin frame, no apparent distress Head: Normocephalic, without obvious abnormality, atraumatic, numerous dental caries Eyes: Arcus senilis, PERRLA, EOMI Nose: Nares, septum and mucosa are normal, no drainage or sinus tenderness Neck: No adenopathy, supple, symmetrical, trachea midline, and is age and in  no tenderness Resp: Clear to auscultation bilaterally Cardio: Regular rate and rhythm, S1, S2 normal, 3/6 murmur, no click, rub or gallop Breasts: Left breast is surgically absent, left chest area has well-healed surgical scars, right breast is pendulous and has glandular tissue, no nipple inversion, no axilla fullness GI: Soft, not distended, non-tender, hypoactive bowel sounds, organomegaly cannot be properly assessed from sitting position Extremities: Bilateral lower extremities have limited range of motion, atraumatic Lymph nodes: Cervical, supraclavicular, and axillary nodes normal Neurologic: Grossly normal, generalized weakness, the patient answers questions appropriately, but is a poor historian  LAB RESULTS: Lab Results  Component Value Date   WBC 13.0* 09/24/2012   NEUTROABS 9.4* 09/21/2012   HGB 8.9* 09/24/2012   HCT 24.8* 09/24/2012   MCV 88.6 09/24/2012   PLT 121* 09/24/2012      Chemistry      Component Value Date/Time   NA 131* 09/24/2012 0525   K 3.8 09/24/2012 0525   CL 98 09/24/2012 0525   CO2 27 09/24/2012 0525   BUN 23 09/24/2012 0525   CREATININE 0.80 09/24/2012 0525      Component Value Date/Time   CALCIUM 8.3* 09/24/2012 0525   ALKPHOS 47 09/21/2012 2105   AST 17 09/21/2012 2105   ALT 11 09/21/2012 2105   BILITOT 0.2* 09/21/2012 2105       Lab Results  Component Value Date   LABCA2 9 11/13/2010    Urinalysis    Component Value Date/Time   COLORURINE YELLOW 09/21/2012 2051   APPEARANCEUR CLEAR 09/21/2012 2051   LABSPEC 1.016 09/21/2012 2051   PHURINE 6.0 09/21/2012 2051   GLUCOSEU 100* 09/21/2012 2051   HGBUR TRACE* 09/21/2012 2051   BILIRUBINUR NEGATIVE 09/21/2012 2051   KETONESUR NEGATIVE 09/21/2012 2051   PROTEINUR NEGATIVE 09/21/2012 2051   UROBILINOGEN 0.2 09/21/2012 2051   NITRITE NEGATIVE 09/21/2012 2051   LEUKOCYTESUR NEGATIVE 09/21/2012 2051    STUDIES: 1.  Dg Hip Complete Right 09/21/2012   *RADIOLOGY REPORT*  Clinical Data: Right hip pain secondary to  a fall.  RIGHT  HIP - COMPLETE 2+ VIEW  Comparison: None.  Findings: There is a comminuted spiral fracture of the proximal femoral shaft extending 25 cm distally.  The fracture does include avulsion of the lesser trochanter.  Fracture does not appear to extend into the greater trochanter.  IMPRESSION: Spiral fracture of the proximal femoral shaft with angulation and displacement.   Original Report Authenticated By: Francene Boyers, M.D.   2.  Dg Femur Right 09/23/2012   *RADIOLOGY REPORT*  Clinical Data: Fracture fixation  DG C-ARM 1-60 MIN - NRPT [facility],RIGHT FEMUR - 2 VIEW  Comparison: 09/21/2012  Findings: Spiral fracture in the femur has been fixed with a locking intramedullary rod and cerclage wires.  Two  threaded screws extend into the femoral head.  The lesser trochanter is avulsed.  Fracture alignment appears satisfactory.  IMPRESSION: Orthopedic fixation of femoral fracture.   Original Report Authenticated By: Janeece Riggers, M.D.   3.  Dg Femur Right 09/21/2012   *RADIOLOGY REPORT*  Clinical Data: Right femur pain secondary to a fall.  RIGHT FEMUR - 2 VIEW  Comparison: None.  Findings: There is an angulated displaced comminuted spiral fracture of the proximal right femoral shaft extending 25 cm distal to the femoral neck.  The lesser trochanter is avulsed.  There is diffuse osteopenia.  Distal femur is intact.  IMPRESSION: Fracture of the proximal right femoral shaft.   Original Report Authenticated By: Francene Boyers, M.D.   4.  Dg Pelvis Portable 09/23/2012   *RADIOLOGY REPORT*  Clinical Data: Postop ORIF of a right femur fracture  PORTABLE PELVIS  Comparison: 09/23/2012 at 1757 hours  Findings: Two views of the proximal mid right femur show a long intramedullary rod supporting to fixation screws crossing the intertrochanteric right proximal femur fracture.  The major fracture fragments are in anatomic alignment.  The lesser trochanter fracture fragment is mildly displaced medially, by 5-6 mm.  The  orthopedic hardware is well seated.  There is overlying soft tissue air and edema as well as lateral scans staples.  IMPRESSION: ORIF of a right proximal femur fracture.  The orthopedic hardware is well seated. Major fracture fragments are in anatomic alignment. No evidence of an operative complication.   Original Report Authenticated By: Amie Portland, M.D.   5.  Dg C-arm 1-60 Min-no Report 09/23/2012   *RADIOLOGY REPORT*  Clinical Data: Fracture fixation  DG C-ARM 1-60 MIN - [facility]PT [facility],RIGHT FEMUR - 2 VIEW  Comparison: 09/21/2012  Findings: Spiral fracture in the femur has been fixed with a locking intramedullary rod and cerclage wires.  Two  threaded screws extend into the femoral head.  The lesser trochanter is avulsed.  Fracture alignment appears satisfactory.  IMPRESSION: Orthopedic fixation of femoral fracture.   Original Report Authenticated By: Janeece Riggers, M.D.   6.  The patient's last bone density scan on 11/19/2010 showed a T score of -2.5 (osteoporosis).    ASSESSMENT: Ms. Rochette is a 79 y.o. [place]: 1.  Status post left breast needle core biopsy and medial mass biopsy on 11/06/2010 which showed what appeared to be a low-grade invasive ductal carcinoma.  2.  Status post left breast needle core biopsy of the 2 o'clock position which is intermediate grade invasive ductal carcinoma and intermediate grade ductal carcinoma in situ with comedonecrosis.  3.  Status post bilateral breast MRI on 11/12/2010 which showed mild background parenchymal enhancement was seen.  A rounded, enhancing mass with indistinct margins was imaged in the medial central aspect of the left breast,  middle one third measuring 0.6 x 0.6 x 0.7 cm.  And indistinct, mildly enhancing mass was imaged in the upper-outer quadrant of the left breast, posteriorly measuring 0.8 x 0.8 x 1.3 cm.  No other suspicious enhancement or mass was seen in either breast.  No axillary internal mammary adenopathy was  present (clinical stage I, T1 N0).  4.  The patient started neoadjuvant antiestrogen therapy with Femara in 10/2010.  5.  Status post left breast simple mastectomy on 10/21/2011 for a stage I, ypT1c, ypNx, 1.1 cm multifocal invasive ductal carcinoma grade 1 with adjacent low-grade ductal carcinoma in situ, estrogen receptor positive 100%, progesterone receptor negative 0%,  Ki-67 9%, HER-2/neu by CISH no amplification detected with a ratio of 1.13.  6.  Osteoporosis    PLAN: Ms. Piscitelli will continue on antiestrogen therapy with Letrozole 2.5 mg by mouth daily.  The patient's son, Alfredo Bach states that she's not in need of a refill of this medication at this time.  She does not report any of the common side effects from taking Letrozole including hot flashes night sweats and vaginal dryness.  The patient has a history of osteoporosis and given her recent fall leading to a femur fracture, we would like her and her son to discuss bone strengthening supplementation either by annual injection (i.e. Reclast) or weekly PO medication (i.e. Alendronate) with her primary care physician Dr. Patty Sermons at their office visit later today. The patient was encouraged to take calcium 600 mg by mouth twice a day (Caltrate) and vitamin D3 2000 IUs by mouth daily for osteoporosis.  Ms. Kincannon had her annual unilateral right mammogram within the last week at [facility]. I contacted them to receive a copy of this report.  We will schedule her annual unilateral right mammogram for her in 09/2013.  We will also schedule the patient for a bone density scan due in 10/2012.  We plan to see the patient again in one year at which time we will check laboratories of CBC, CMP, LDH, and vitamin D level.  All questions were answered.  The patient was encouraged to contact us in the interim with any problems, questions or concerns.   Larina Bras, NP-C 10/18/2012, 12:43 PM  ADDENDUM: This 79 year old [place] woman  establish herself in my practice today. We reviewed her diagnosis, treatment history, and prognosis in detail. In brief:  She had breast biopsy x2 in August of 2012 for invasive ductal carcinomas involving the left breast, low-grade and intermediate grade, with MRI showing the largest mass to be a clinical T1c N0 tumor. After one year of neoadjuvant letrozole she underwent left simple mastectomy July of 2013 for a yp T1c ypNX residual invasive ductal carcinoma, grade 1, estrogen receptor 100% positive, progesterone receptor and HER-2 negative, with an MIB-1 of 9%.  The patient did not require postmastectomy radiation and has continued on letrozole with good tolerance.  The plan is to continue letrozole for a total of 5 years. Bone density will be repeated later this year. The patient will continue routine yearly followup here as before.  I personally saw this patient and performed a substantive portion of this encounter with the listed APP documented above.   Lowella Dell, MD

## 2012-10-18 NOTE — Progress Notes (Signed)
Tricia [name] Date of Birth: 06-14-1925 Medical Record #[id number]  History of Present Illness: Tricia [name] is seen back today for a 2 1/2 month check. Seen for Dr. Patty Sermons. Has atrial fib, HTN, prior breast cancer,  IHSS. She is 79 years of age. She is on chronic amiodarone. She has some dementia as well. Apparently has alcohol and tobacco abuse.   Last seen in May - amiodarone was cut back and Toprol was resumed.   Comes in today. Here with her son. Has had a recent fall while at [employer]. Broke her right femur by tripping on a rug. Has now had 2 falls in 4 months in the setting of alcohol use and now off of Xarelto. Now at [facility]. She says she is doing ok. Not able to smoke or drink while there. Not dizzy or lightheaded. She says she is doing ok. She is on Lovenox daily while she is at the SNF. The issue of her long term anticoagulation will need to be addressed in the future. She had labs earlier this month at [facility].   Current Outpatient Prescriptions  Medication Sig Dispense Refill  . amiodarone (PACERONE) 200 MG tablet Take 1 tablet (200 mg total) by mouth daily.      . calcium-vitamin D (OSCAL WITH D) 500-200 MG-UNIT per tablet Take 1 tablet by mouth every evening.      . donepezil (ARICEPT) 5 MG tablet Take 5 mg by mouth at bedtime.       . enoxaparin (LOVENOX) 40 MG/0.4ML injection Inject 40 mg into the skin daily.      Marland Kitchen glucosamine-chondroitin 500-400 MG tablet Take 1 tablet by mouth daily.       Marland Kitchen HYDROcodone-acetaminophen (NORCO/VICODIN) 5-325 MG per tablet Take one tablet by mouth every eight hours; Take one tablet by mouth every 6 hours as needed for pain  210 tablet  0  . letrozole (FEMARA) 2.5 MG tablet Take 1 tablet (2.5 mg total) by mouth daily. Please make follow up appointment  90 tablet  0  . metoprolol succinate (TOPROL-XL) 50 MG 24 hr tablet Take 1 tablet (50 mg total) by mouth daily. Take with or immediately following a meal.  30 tablet  5  . Multiple  Vitamin (MULTIVITAMIN WITH MINERALS) TABS Take 1 tablet by mouth daily.       No current facility-administered medications for this visit.    No Known Allergies  Past Medical History  Diagnosis Date  . Idiopathic hypertrophic subaortic stenosis   . Irregular heart beat   . Fibrocystic breast disease   . Tobacco abuse   . Hypertension   . History of colonoscopy   . Blood transfusion 10 yrs ago    after bleeding ulcer in stomach  . Heart murmur   . Cancer     left breast  . Atrial fibrillation     On chronic Xarelto  . Femur fracture, right 08/2012    Past Surgical History  Procedure Laterality Date  . US echocardiography  12/05/2008    EF 60-65%  . US echocardiography  12/01/2007    EF 60-65%  . US echocardiography  07/01/2006    EF 55-60%  . US echocardiography  06/26/2005    EF 55-60%  . Cardiac catheterization  01/30/2002    EF 75-80%  . Appendectomy  1946  . Right breast benign lump removed  yrs ago  . Breast surgery  10/21/11    simple mastectomy  . Orif femur fracture Right  09/23/2012    Procedure: OPEN REDUCTION INTERNAL FIXATION (ORIF) RIGHT DISTAL FEMUR FRACTURE;  Surgeon: Shelda Pal, MD;  Location: [facility];  Service: Orthopedics;  Laterality: Right;    History  Smoking status  . Former Smoker -- 1.00 packs/day for 74 years  . Types: Cigarettes  . Quit date: 09/21/2012  Smokeless tobacco  . Never Used    History  Alcohol Use No    Family History  Problem Relation Age of Onset  . Heart attack Mother   . Breast cancer Mother   . Cancer Mother     breast  . Diabetes Father   . Stomach cancer Brother   . Cancer Brother     colon    Review of Systems: The review of systems is per the HPI.  All other systems were reviewed and are negative.  Physical Exam: BP 130/60  Ht 5\' 3"  (1.6 m)  Wt 114 lb 12.8 oz (52.073 kg)  BMI 20.34 kg/m2 Patient is very pleasant and in no acute distress. Looks chronically ill. Skin is warm and dry. Color is normal.   HEENT is unremarkable. Normocephalic/atraumatic. PERRL. Sclera are nonicteric. Neck is supple. No masses. No JVD. Lungs are clear. Cardiac exam shows a regular rate and rhythm. Harsh murmur at the apex noted. Abdomen is soft. Extremities are without edema. Gait is not tested. She is in a wheelchair. No gross neurologic deficits noted.  LABORATORY DATA:  EKG shows sinus bradycardia with incomplete RBBB.   Lab Results  Component Value Date   WBC 13.0* 09/24/2012   HGB 8.9* 09/24/2012   HCT 24.8* 09/24/2012   PLT 121* 09/24/2012   GLUCOSE 134* 09/24/2012   CHOL 190 05/16/2012   TRIG 90 05/16/2012   HDL 86 05/16/2012   LDLCALC 86 05/16/2012   ALT 11 09/21/2012   AST 17 09/21/2012   NA 131* 09/24/2012   K 3.8 09/24/2012   CL 98 09/24/2012   CREATININE 0.80 09/24/2012   BUN 23 09/24/2012   CO2 27 09/24/2012   TSH 0.941 05/14/2012   INR 1.09 09/22/2012    Assessment / Plan: 1. Fall with right femur fracture - currently in SNF   2. Atrial fib - on amiodarone and in sinus today. Off of her Xarelto. Probably does not need to resume if she is discharged back to her own residence given that she has had 2 falls and has alcohol use.   3. IHSS - no syncope. On beta blocker therapy.  She has a visit with Dr. Patty Sermons for August. Will check labs at that visit. No change in her current regimen. She is a DNR.   Patient is agreeable to this plan and will call if any problems develop in the interim.   Rosalio Macadamia, RN, ANP-C [facility] [address] [place], [place]  [address]

## 2012-10-18 NOTE — Telephone Encounter (Signed)
appts made and printed. i also gv appt d/t for mammo @ [facility] for 10/10/13@ 9:30am...td

## 2012-10-18 NOTE — Patient Instructions (Addendum)
Please contact us at [phone] if you have any questions or concerns.  Please continue to do well and enjoy life!!!  Get plenty of rest, drink plenty of water, exercise daily (walking when you are able to), eat a balanced diet.  Take Calcium 600 mg twice daily and Vitamin D3 2000 IUs daily.   Talk to Dr. Patty Sermons about help with osteoporosis with either injection yearly or pill once weekly.  Forward laboratory results from Dr. Yevonne Pax office to our office once obtained.  (Thank you)

## 2012-10-18 NOTE — Patient Instructions (Addendum)
Stay on your current medicines  We will check labs on your return visit in August  Call the [facility] office at [phone] if you have any questions, problems or concerns.

## 2012-10-19 ENCOUNTER — Telehealth: Payer: Self-pay | Admitting: Oncology

## 2012-10-19 ENCOUNTER — Encounter (INDEPENDENT_AMBULATORY_CARE_PROVIDER_SITE_OTHER): Payer: Self-pay | Admitting: General Surgery

## 2012-10-19 NOTE — Telephone Encounter (Signed)
Mailed the pt her bone density appt along with the schedule.

## 2012-11-01 ENCOUNTER — Telehealth: Payer: Self-pay | Admitting: *Deleted

## 2012-11-01 ENCOUNTER — Ambulatory Visit (INDEPENDENT_AMBULATORY_CARE_PROVIDER_SITE_OTHER): Payer: Medicare Other | Admitting: Cardiology

## 2012-11-01 ENCOUNTER — Encounter: Payer: Self-pay | Admitting: Cardiology

## 2012-11-01 VITALS — BP 130/72 | HR 53 | Ht 65.5 in | Wt 114.8 lb

## 2012-11-01 DIAGNOSIS — F0280 Dementia in other diseases classified elsewhere without behavioral disturbance: Secondary | ICD-10-CM

## 2012-11-01 DIAGNOSIS — F0390 Unspecified dementia without behavioral disturbance: Secondary | ICD-10-CM

## 2012-11-01 DIAGNOSIS — R5381 Other malaise: Secondary | ICD-10-CM

## 2012-11-01 DIAGNOSIS — G309 Alzheimer's disease, unspecified: Secondary | ICD-10-CM

## 2012-11-01 DIAGNOSIS — R5383 Other fatigue: Secondary | ICD-10-CM

## 2012-11-01 DIAGNOSIS — I480 Paroxysmal atrial fibrillation: Secondary | ICD-10-CM

## 2012-11-01 DIAGNOSIS — I4891 Unspecified atrial fibrillation: Secondary | ICD-10-CM

## 2012-11-01 DIAGNOSIS — S7291XS Unspecified fracture of right femur, sequela: Secondary | ICD-10-CM

## 2012-11-01 DIAGNOSIS — S8290XS Unspecified fracture of unspecified lower leg, sequela: Secondary | ICD-10-CM

## 2012-11-01 LAB — BASIC METABOLIC PANEL
CO2: 31 mEq/L (ref 19–32)
GFR: 71.44 mL/min (ref >60.00)
Glucose, Bld: 96 mg/dL (ref 70–99)
Potassium: 4.4 mEq/L (ref 3.5–5.1)
Sodium: 137 mEq/L (ref 135–145)

## 2012-11-01 LAB — CBC
Hemoglobin: 12.8 g/dL (ref 12.0–15.0)
MCHC: 34 g/dL (ref 30.0–36.0)
Platelets: 216 10*3/uL (ref 150.0–400.0)
RBC: 3.84 Mil/uL — ABNORMAL LOW (ref 3.87–5.11)

## 2012-11-01 NOTE — Progress Notes (Signed)
Tricia [name] Date of Birth:  [birth year] [facility] [address] [place], [place]  [address] [phone]         Fax   [phone]  History of Present Illness: This pleasant 79 year old woman is seen for a scheduled followup office visit. Has atrial fib, HTN, prior breast cancer, IHSS.  She is on chronic amiodarone.  She has a past history of dementia.  She is a former smoker.  She has a past history of heavy alcohol intake and it is not clear if she is still drinking or not   Today the patient is seen alone.  No family with her.  She states that she is now living at home and that her son lives with her.  However she brought a lot of papers with her from the nursing home [facility] suggesting that she is still there.  Her history is not reliable.  She is confused.  She knows that it is Monday but does not know which month or what the date is.   Current Outpatient Prescriptions  Medication Sig Dispense Refill  . amiodarone (PACERONE) 200 MG tablet Take 1 tablet (200 mg total) by mouth daily.      . calcium-vitamin D (OSCAL WITH D) 500-200 MG-UNIT per tablet Take 1 tablet by mouth every evening.      . donepezil (ARICEPT) 5 MG tablet Take 5 mg by mouth at bedtime.       . ferrous sulfate 325 (65 FE) MG tablet Take 325 mg by mouth 2 (two) times daily.      Marland Kitchen glucosamine-chondroitin 500-400 MG tablet Take 1 tablet by mouth daily.       Marland Kitchen HYDROcodone-acetaminophen (NORCO/VICODIN) 5-325 MG per tablet Take one tablet by mouth every eight hours; Take one tablet by mouth every 6 hours as needed for pain  210 tablet  0  . letrozole (FEMARA) 2.5 MG tablet Take 1 tablet (2.5 mg total) by mouth daily. Please make follow up appointment  90 tablet  0  . metoprolol succinate (TOPROL-XL) 50 MG 24 hr tablet Take 1 tablet (50 mg total) by mouth daily. Take with or immediately following a meal.  30 tablet  5  . Multiple Vitamin (MULTIVITAMIN WITH MINERALS) TABS Take 1 tablet by  mouth daily.      . polyethylene glycol (MIRALAX / GLYCOLAX) packet Take 17 g by mouth daily.      Tricia [name] Sodium (SENOKOT S PO) Take 2 tablets by mouth at bedtime.      . enoxaparin (LOVENOX) 40 MG/0.4ML injection Inject 40 mg into the skin daily.       No current facility-administered medications for this visit.    No Known Allergies  Patient Active Problem List   Diagnosis Date Noted  . Alzheimer's dementia 09/27/2012  . Pain 09/27/2012  . Paroxysmal atrial fibrillation 09/22/2012  . Pre-operative cardiovascular examination 09/22/2012  . Femur fracture, right 09/21/2012  . Hypokalemia 05/17/2012  . Acute delirium 05/16/2012  . NSTEMI (non-ST elevated myocardial infarction) 05/15/2012  . Fall at home 05/14/2012  . Rapid atrial fibrillation 05/14/2012  . Hypotension, unspecified 05/14/2012  . Near syncope 05/13/2012  . Scalp laceration 05/13/2012  . Cancer of upper-outer quadrant of female breast 05/14/2011  . IHSS (idiopathic hypertrophic subaortic stenosis) 11/01/2010  . Tobacco abuse counseling 11/01/2010  . Benign hypertensive heart disease without heart failure 11/01/2010    History  Smoking status  . Former Smoker -- 1.00 packs/day for 74 years  .  Types: Cigarettes  . Quit date: 09/21/2012  Smokeless tobacco  . Never Used    History  Alcohol Use No    Family History  Problem Relation Age of Onset  . Heart attack Mother   . Breast cancer Mother   . Cancer Mother     breast  . Diabetes Father   . Stomach cancer Brother   . Cancer Brother     colon    Review of Systems: Constitutional: no fever chills diaphoresis or fatigue or change in weight.  Head and neck: no hearing loss, no epistaxis, no photophobia or visual disturbance. Respiratory: No cough, shortness of breath or wheezing. Cardiovascular: No chest pain peripheral edema, palpitations. Gastrointestinal: No abdominal distention, no abdominal pain, no change in bowel habits  hematochezia or melena. Genitourinary: No dysuria, no frequency, no urgency, no nocturia. Musculoskeletal:No arthralgias, no back pain, no gait disturbance or myalgias. Neurological: No dizziness, no headaches, no numbness, no seizures, no syncope, no weakness, no tremors. Hematologic: No lymphadenopathy, no easy bruising. Psychiatric: No confusion, no hallucinations, no sleep disturbance.    Physical Exam: Filed Vitals:   11/01/12 1015  BP: 130/72  Pulse: 53   the general appearance reveals a chronically ill-appearing woman who is pleasant and conversant but he was not oriented to date.The head and neck exam reveals pupils equal and reactive.  Extraocular movements are full.  There is no scleral icterus.  The mouth and pharynx are normal.  The neck is supple.  The carotids reveal no bruits.  The jugular venous pressure is normal.  The  thyroid is not enlarged.  There is no lymphadenopathy.  The chest is clear to percussion and auscultation.  There are no rales or rhonchi.  Expansion of the chest is symmetrical.  The precordium is quiet.  The first heart sound is normal.  The second heart sound is physiologically split.  There is grade 2/6 systolic ejection murmur over the left ventricular outflow.  No diastolic murmur. There is no abnormal lift or heave.  The abdomen is soft and nontender.  The bowel sounds are normal.  The liver and spleen are not enlarged.  There are no abdominal masses.  There are no abdominal bruits.  Extremities reveal good pedal pulses.  There is no phlebitis or edema.  There is no cyanosis or clubbing.  Strength is normal and symmetrical in all extremities.  There is no lateralizing weakness.  There are no sensory deficits.  The skin is warm and dry.  There is no rash.  EKG today shows sinus bradycardia with first degree AV block and is unchanged and 10/18/12  Assessment / Plan: Continue present cardiac medications.  We're checking a CBC and a basal metabolic panel today.   Recheck in 2 months for followup office.

## 2012-11-01 NOTE — Assessment & Plan Note (Signed)
Patient is on generic Aricept for her dementia.

## 2012-11-01 NOTE — Patient Instructions (Addendum)
Your physician recommends that you return for lab work in: today bmet cbc  Your physician recommends that you schedule a follow-up appointment in: 2 months

## 2012-11-01 NOTE — Telephone Encounter (Signed)
Called and spoke to [facility] to make an appt. For patient. They state they will call back and schedule patient's appt.  Contact information given.

## 2012-11-01 NOTE — Assessment & Plan Note (Signed)
Today the patient is in normal sinus rhythm.  She is not aware of any recent palpitations or tachycardia but her history is not reliable.

## 2012-11-01 NOTE — Assessment & Plan Note (Signed)
The patient is recovering from surgery for a right femur fracture.  She is traveling by wheelchair.  No evidence of phlebitis.

## 2012-11-02 ENCOUNTER — Other Ambulatory Visit: Payer: Self-pay | Admitting: *Deleted

## 2012-11-02 ENCOUNTER — Other Ambulatory Visit: Payer: Medicare Other

## 2012-11-02 DIAGNOSIS — D62 Acute posthemorrhagic anemia: Secondary | ICD-10-CM | POA: Insufficient documentation

## 2012-11-02 NOTE — Progress Notes (Signed)
Patient ID: Tricia [name], female   DOB: 1925/11/04, 79 y.o.   MRN: [id number]        HISTORY & PHYSICAL  DATE: 10/07/2012   FACILITY: [facility]  LEVEL OF CARE: SNF (31)  ALLERGIES:  No Known Allergies  CHIEF COMPLAINT:  Manage right femur fracture, atrial fibrillation, and hypertension.    HISTORY OF PRESENT ILLNESS:  The patient is an 79 year-old, Caucasian female.    HIP FRACTURE: The patient had a mechanical fall and sustained a femur fracture.  Patient subsequently underwent surgical repair and tolerated the procedure well. Patient is admitted to this facility for short-term rehabilitation. Patient denies hip pain currently. No complications reported from the pain medications currently being used.   ATRIAL FIBRILLATION: the patients atrial fibrillation remains stable.  The patient denies DOE, tachycardia, orthopnea, transient neurological sx, pedal edema, palpitations, & PNDs.  No complications noted from the medications currently being used.   HTN: Pt 's HTN remains stable.  Denies CP, sob, DOE, pedal edema, headaches, dizziness or visual disturbances.  No complications from the medications currently being used.  Last BP :  159/80, 123/79, 129/73.   PAST MEDICAL HISTORY :  Past Medical History  Diagnosis Date  . Idiopathic hypertrophic subaortic stenosis   . Irregular heart beat   . Fibrocystic breast disease   . Tobacco abuse   . Hypertension   . History of colonoscopy   . Blood transfusion 10 yrs ago    after bleeding ulcer in stomach  . Heart murmur   . Cancer     left breast  . Atrial fibrillation     On chronic Xarelto  . Femur fracture, right 08/2012    PAST SURGICAL HISTORY: Past Surgical History  Procedure Laterality Date  . US echocardiography  12/05/2008    EF 60-65%  . US echocardiography  12/01/2007    EF 60-65%  . US echocardiography  07/01/2006    EF 55-60%  . US echocardiography  06/26/2005    EF 55-60%  . Cardiac catheterization   01/30/2002    EF 75-80%  . Appendectomy  1946  . Right breast benign lump removed  yrs ago  . Breast surgery  10/21/11    simple mastectomy  . Orif femur fracture Right 09/23/2012    Procedure: OPEN REDUCTION INTERNAL FIXATION (ORIF) RIGHT DISTAL FEMUR FRACTURE;  Surgeon: Shelda Pal, MD;  Location: [facility];  Service: Orthopedics;  Laterality: Right;    SOCIAL HISTORY:  reports that she quit smoking about 6 weeks ago. Her smoking use included Cigarettes. She has a 74 pack-year smoking history. She has never used smokeless tobacco. She reports that she does not drink alcohol or use illicit drugs.  FAMILY HISTORY:  Family History  Problem Relation Age of Onset  . Heart attack Mother   . Breast cancer Mother   . Cancer Mother     breast  . Diabetes Father   . Stomach cancer Brother   . Cancer Brother     colon    CURRENT MEDICATIONS: Reviewed per [facility]  REVIEW OF SYSTEMS:  See HPI otherwise 14 point ROS is negative.  PHYSICAL EXAMINATION  VS:  T 97.7       P 72    RR 18      BP 123/79      POX 97% room air        WT (Lb)  GENERAL: no acute distress, normal body habitus EYES: conjunctivae normal,  sclerae normal, normal eye lids MOUTH/THROAT: lips without lesions,no lesions in the mouth,tongue is without lesions,uvula elevates in midline NECK: supple, trachea midline, no neck masses, no thyroid tenderness, no thyromegaly LYMPHATICS: no LAN in the neck, no supraclavicular LAN RESPIRATORY: breathing is even & unlabored, BS CTAB CARDIAC: RRR, no murmur,no extra heart sounds EDEMA/VARICOSITIES:  +1 bilateral lower extremity edema  GI:  ABDOMEN: abdomen soft, normal BS, no masses, no tenderness  LIVER/SPLEEN: no hepatomegaly, no splenomegaly MUSCULOSKELETAL: HEAD: normal to inspection & palpation BACK: no kyphosis, scoliosis or spinal processes tenderness EXTREMITIES: LEFT UPPER EXTREMITY: full range of motion, normal strength & tone RIGHT UPPER EXTREMITY:  full range of motion,  normal strength & tone LEFT LOWER EXTREMITY: strength intact, range of motion moderate  RIGHT LOWER EXTREMITY: strength intact, range of motion not tested PSYCHIATRIC: the patient is alert & oriented to person, affect & behavior appropriate  LABS/RADIOLOGY: Hemoglobin 8.3, MCV 91.4, otherwise CBC normal.    Glucose 101, BUN 32, otherwise BMP normal.    Right hip x-ray showed spiral fracture of the proximal femoral shaft with angulation and displacement.    Right femur x-ray postoperatively showed orthopedic fixation of femoral fracture.    MRSA by PCR negative.    Staph aureus by PCR negative.    Glucose 134, otherwise CMP normal.    Albumin 3.4, otherwise liver profile normal.    Pelvic x-ray showed ORIF of right proximal femur fracture.  Hardware well fixated.    ASSESSMENT/PLAN:  Right femur fracture.  Status post ORIF.  Continue rehabilitation.   Atrial fibrillation.  Rate controlled.    Hypertension.  Overall, well controlled.    Acute blood loss anemia.  Iron was started.    Constipation.  Senna and MiraLAX were started.    Dementia.  Stable.   I have reviewed patient's medical records received at admission/from hospitalization.  CPT CODE: 11914

## 2012-11-09 ENCOUNTER — Non-Acute Institutional Stay (SKILLED_NURSING_FACILITY): Payer: Medicare Other | Admitting: Adult Health

## 2012-11-09 ENCOUNTER — Encounter: Payer: Self-pay | Admitting: Adult Health

## 2012-11-09 DIAGNOSIS — K5900 Constipation, unspecified: Secondary | ICD-10-CM

## 2012-11-09 DIAGNOSIS — I4891 Unspecified atrial fibrillation: Secondary | ICD-10-CM

## 2012-11-09 DIAGNOSIS — S7291XD Unspecified fracture of right femur, subsequent encounter for closed fracture with routine healing: Secondary | ICD-10-CM

## 2012-11-09 DIAGNOSIS — I480 Paroxysmal atrial fibrillation: Secondary | ICD-10-CM

## 2012-11-09 DIAGNOSIS — F0390 Unspecified dementia without behavioral disturbance: Secondary | ICD-10-CM | POA: Insufficient documentation

## 2012-11-09 DIAGNOSIS — I10 Essential (primary) hypertension: Secondary | ICD-10-CM

## 2012-11-09 DIAGNOSIS — D649 Anemia, unspecified: Secondary | ICD-10-CM

## 2012-11-09 DIAGNOSIS — S7290XD Unspecified fracture of unspecified femur, subsequent encounter for closed fracture with routine healing: Secondary | ICD-10-CM

## 2012-11-09 NOTE — Progress Notes (Signed)
Patient ID: PEJA Tricia [name], female   DOB: 02-03-1926, 79 y.o.   MRN: [id number]        PROGRESS NOTE  DATE: 11/09/2012  FACILITY: Nursing Home Location: [facility]  LEVEL OF CARE: SNF (31)  Routine Visit  CHIEF COMPLAINT:  Manage Hypertension, Atrial Fibrillation and Dementia   HISTORY OF PRESENT ILLNESS:  REASSESSMENT OF ONGOING PROBLEM(S):  HTN: Pt 's HTN remains stable.  Denies CP, sob, DOE, pedal edema, headaches, dizziness or visual disturbances.  No complications from the medications currently being used.  Last BP : 141/78  ANEMIA: The anemia has been stable. The patient denies fatigue, melena or hematochezia. No complications from the medications currently being used.  ATRIAL FIBRILLATION: the patients atrial fibrillation remains stable.  The patient denies DOE, tachycardia, orthopnea, transient neurological sx, pedal edema, palpitations, & PNDs.  No complications noted from the medications currently being used.   PAST MEDICAL HISTORY : Reviewed.  No changes.  CURRENT MEDICATIONS: Reviewed per [facility]  REVIEW OF SYSTEMS:  GENERAL: no change in appetite, no fatigue, no weight changes, no fever, chills or weakness RESPIRATORY: no cough, SOB, DOE, wheezing, hemoptysis CARDIAC: no chest pain, edema or palpitations GI: no abdominal pain, diarrhea, constipation, heart burn, nausea or vomiting  PHYSICAL EXAMINATION  VS:  T96.3       P80      RR20      BP141/78     POX96 %     WT  115(Lb)  GENERAL: no acute distress, normal body habitus EYES: conjunctivae normal, sclerae normal, normal eye lids NECK: supple, trachea midline, no neck masses, no thyroid tenderness, no thyromegaly LYMPHATICS: no LAN in the neck, no supraclavicular LAN RESPIRATORY: breathing is even & unlabored, BS CTAB CARDIAC: RRR, no murmur,no extra heart sounds, no edema GI: abdomen soft, normal BS, no masses, no tenderness, no hepatomegaly, no splenomegaly PSYCHIATRIC: the patient is alert  & oriented to person, affect & behavior appropriate  LABS/RADIOLOGY:  10/14/12 WBC 20.7 hemoglobin 10.9 hematocrit 32.0  10/12/12 WBC 5.2 hemoglobin 10.4 hematocrit 30.1  10/04/12 WBC 6.5 hemoglobin 8.3 hematocrit 24.6  09/27/12 WBC 6.4 hemoglobin 7.3 hematocrit 21.1 sodium 139 potassium 3.8 glucose 101 BUN 32 creatinine 0.623 calcium 7.9    ASSESSMENT/PLAN:   right femur fracture status post ORIF  - continue PT and OT   Hypertension  - well controlled   Atrial fibrillation  - rate controlled   Dementia - stable   Anemia - stable  Constipation - no complaints

## 2012-12-02 ENCOUNTER — Non-Acute Institutional Stay (SKILLED_NURSING_FACILITY): Payer: Medicare Other | Admitting: Adult Health

## 2012-12-02 DIAGNOSIS — I480 Paroxysmal atrial fibrillation: Secondary | ICD-10-CM

## 2012-12-02 DIAGNOSIS — I10 Essential (primary) hypertension: Secondary | ICD-10-CM

## 2012-12-02 DIAGNOSIS — I4891 Unspecified atrial fibrillation: Secondary | ICD-10-CM

## 2012-12-02 DIAGNOSIS — F0390 Unspecified dementia without behavioral disturbance: Secondary | ICD-10-CM

## 2012-12-02 DIAGNOSIS — S7290XD Unspecified fracture of unspecified femur, subsequent encounter for closed fracture with routine healing: Secondary | ICD-10-CM

## 2012-12-02 DIAGNOSIS — S7291XD Unspecified fracture of right femur, subsequent encounter for closed fracture with routine healing: Secondary | ICD-10-CM

## 2012-12-02 DIAGNOSIS — K5900 Constipation, unspecified: Secondary | ICD-10-CM

## 2012-12-15 ENCOUNTER — Ambulatory Visit
Admission: RE | Admit: 2012-12-15 | Discharge: 2012-12-15 | Disposition: A | Discharge status: Home / Self Care | Payer: Medicare Other | Source: Ambulatory Visit | Attending: Family | Admitting: Family

## 2012-12-15 DIAGNOSIS — M810 Age-related osteoporosis without current pathological fracture: Secondary | ICD-10-CM

## 2012-12-16 ENCOUNTER — Telehealth: Payer: Self-pay | Admitting: Family

## 2012-12-16 NOTE — Telephone Encounter (Signed)
Spoke to the patient's son Alfredo Bach at the home contact number listed for the patient.  Cecil instructed me to call in [facility] [phone]) where Ms. Allie is residing to give them the results of her recent bone density scan.  Spoke to Chalmers, LPN at [facility] and explained to her that bone density scan showed osteoporosis with a T score of -2.9.  Asked her to have Ms. Kipper's medications adjusted to increase calcium with vitamin D tablets to twice daily instead of once daily and to take a separate vitamin D3 1000 IUs by mouth daily.  Asked Scarlette Calico to also remind the patient to drink plenty of water to avoid constipation.  The patient is currently taking Senokot 2 tablets each bedtime.

## 2012-12-31 ENCOUNTER — Other Ambulatory Visit: Payer: Self-pay | Admitting: *Deleted

## 2012-12-31 MED ORDER — LORAZEPAM 0.5 MG PO TABS: ORAL_TABLET | ORAL | Status: AC

## 2012-12-31 NOTE — Telephone Encounter (Signed)
rx printed/signed/faxed 

## 2013-01-05 ENCOUNTER — Encounter: Payer: Self-pay | Admitting: Cardiology

## 2013-01-05 ENCOUNTER — Ambulatory Visit (INDEPENDENT_AMBULATORY_CARE_PROVIDER_SITE_OTHER): Payer: Medicare Other | Admitting: Cardiology

## 2013-01-05 VITALS — BP 115/74 | HR 50 | Ht 65.5 in | Wt 116.0 lb

## 2013-01-05 DIAGNOSIS — I480 Paroxysmal atrial fibrillation: Secondary | ICD-10-CM

## 2013-01-05 DIAGNOSIS — I421 Obstructive hypertrophic cardiomyopathy: Secondary | ICD-10-CM

## 2013-01-05 DIAGNOSIS — S8290XS Unspecified fracture of unspecified lower leg, sequela: Secondary | ICD-10-CM

## 2013-01-05 DIAGNOSIS — I4891 Unspecified atrial fibrillation: Secondary | ICD-10-CM

## 2013-01-05 DIAGNOSIS — I119 Hypertensive heart disease without heart failure: Secondary | ICD-10-CM

## 2013-01-05 DIAGNOSIS — F0390 Unspecified dementia without behavioral disturbance: Secondary | ICD-10-CM

## 2013-01-05 DIAGNOSIS — S7291XS Unspecified fracture of right femur, sequela: Secondary | ICD-10-CM

## 2013-01-05 NOTE — Progress Notes (Signed)
Tricia [name] Date of Birth:  12-03-1925 Tricia [name] [facility] [address] [place], [place]  [address] [phone]         Fax   [phone]  History of Present Illness: This pleasant 78 year old woman is seen for a scheduled followup office visit. Has atrial fib, HTN, prior breast cancer, IHSS.  She is on chronic amiodarone.  She has a past history of dementia.  She is a former smoker.  She has a past history of heavy alcohol intake.  The patient is seen today with her son.  The patient has been at [facility] skilled nursing facility for about 3 months.  She is now getting physical therapy for her previous fracture of the right femur.  She is walking with a walker.  Current Outpatient Prescriptions  Medication Sig Dispense Refill  . amiodarone (PACERONE) 200 MG tablet Take 1 tablet (200 mg total) by mouth daily.      . calcium-vitamin D (OSCAL WITH D) 500-200 MG-UNIT per tablet Take 1 tablet by mouth every evening.      . donepezil (ARICEPT) 5 MG tablet Take 5 mg by mouth at bedtime.       . enoxaparin (LOVENOX) 40 MG/0.4ML injection Inject 40 mg into the skin daily.      . ferrous sulfate 325 (65 FE) MG tablet Take 325 mg by mouth 2 (two) times daily.      Marland Kitchen glucosamine-chondroitin 500-400 MG tablet Take 1 tablet by mouth daily.       Marland Kitchen HYDROcodone-acetaminophen (NORCO/VICODIN) 5-325 MG per tablet Take one tablet by mouth every eight hours; Take one tablet by mouth every 6 hours as needed for pain  210 tablet  0  . letrozole (FEMARA) 2.5 MG tablet Take 1 tablet (2.5 mg total) by mouth daily. Please make follow up appointment  90 tablet  0  . LORazepam (ATIVAN) 0.5 MG tablet Take 1 tablet by mouth 3 times a day as need for Anxiety/Agitation  30 tablet  1  . metoprolol succinate (TOPROL-XL) 50 MG 24 hr tablet Take 1 tablet (50 mg total) by mouth daily. Take with or immediately following a meal.  30 tablet  5  . Multiple Vitamin (MULTIVITAMIN WITH MINERALS) TABS Take 1  tablet by mouth daily.      . polyethylene glycol (MIRALAX / GLYCOLAX) packet Take 17 g by mouth daily.      [name] Sodium (SENOKOT S PO) Take 2 tablets by mouth at bedtime.       No current facility-administered medications for this visit.    No Known Allergies  Patient Active Problem List   Diagnosis Date Noted  . Dementia 11/09/2012  . Constipation 11/09/2012  . Essential hypertension, benign 11/09/2012  . Anemia 11/09/2012  . Acute posthemorrhagic anemia 11/02/2012  . Alzheimer's dementia 09/27/2012  . Pain 09/27/2012  . Paroxysmal atrial fibrillation 09/22/2012  . Pre-operative cardiovascular examination 09/22/2012  . Femur fracture, right 09/21/2012  . Hypokalemia 05/17/2012  . Acute delirium 05/16/2012  . NSTEMI (non-ST elevated myocardial infarction) 05/15/2012  . Fall at home 05/14/2012  . Rapid atrial fibrillation 05/14/2012  . Hypotension, unspecified 05/14/2012  . Near syncope 05/13/2012  . Scalp laceration 05/13/2012  . Cancer of upper-outer quadrant of female breast 05/14/2011  . IHSS (idiopathic hypertrophic subaortic stenosis) 11/01/2010  . Tobacco abuse counseling 11/01/2010  . Benign hypertensive heart disease without heart failure 11/01/2010    History  Smoking status  . Former Smoker -- 1.00  packs/day for 74 years  . Types: Cigarettes  . Quit date: 09/21/2012  Smokeless tobacco  . Never Used    History  Alcohol Use No    Family History  Problem Relation Age of Onset  . Heart attack Mother   . Breast cancer Mother   . Cancer Mother     breast  . Diabetes Father   . Stomach cancer Brother   . Cancer Brother     colon    Review of Systems: Constitutional: no fever chills diaphoresis or fatigue or change in weight.  Head and neck: no hearing loss, no epistaxis, no photophobia or visual disturbance. Respiratory: No cough, shortness of breath or wheezing. Cardiovascular: No chest pain peripheral edema,  palpitations. Gastrointestinal: No abdominal distention, no abdominal pain, no change in bowel habits hematochezia or melena. Genitourinary: No dysuria, no frequency, no urgency, no nocturia. Musculoskeletal:No arthralgias, no back pain, no gait disturbance or myalgias. Neurological: No dizziness, no headaches, no numbness, no seizures, no syncope, no weakness, no tremors. Hematologic: No lymphadenopathy, no easy bruising. Psychiatric: No confusion, no hallucinations, no sleep disturbance.    Physical Exam: Filed Vitals:   01/05/13 1015  BP: 115/74  Pulse: 50   the general appearance reveals a chronically ill-appearing woman who is pleasant and conversant but he was not oriented to date.The head and neck exam reveals pupils equal and reactive.  Extraocular movements are full.  There is no scleral icterus.  The mouth and pharynx are normal.  The neck is supple.  The carotids reveal no bruits.  The jugular venous pressure is normal.  The  thyroid is not enlarged.  There is no lymphadenopathy.  The chest is clear to percussion and auscultation.  There are no rales or rhonchi.  Expansion of the chest is symmetrical.  The precordium is quiet.  The first heart sound is normal.  The second heart sound is physiologically split.  There is grade 2/6 systolic ejection murmur over the left ventricular outflow.  No diastolic murmur. There is no abnormal lift or heave.  The abdomen is soft and nontender.  The bowel sounds are normal.  The liver and spleen are not enlarged.  There are no abdominal masses.  There are no abdominal bruits.  Extremities reveal good pedal pulses.  There is no phlebitis or edema.  There is no cyanosis or clubbing.  Strength is normal and symmetrical in all extremities.  There is no lateralizing weakness.  There are no sensory deficits.  The skin is warm and dry.  There is no rash.  EKG today shows sinus bradycardia with first degree AV block and is unchanged since previous  tracing.  Assessment / Plan: Continue present cardiac medications.  Recheck here when necessary since she will be moving to [place].

## 2013-01-05 NOTE — Assessment & Plan Note (Signed)
Blood pressure was remaining stable on current therapy.  Denies headaches or dizzy spells.

## 2013-01-05 NOTE — Assessment & Plan Note (Signed)
Family states that her symptoms of dementia have worsened.  The family will be moving her to a facility in [facility] later this week.

## 2013-01-05 NOTE — Patient Instructions (Signed)
Your physician recommends that you continue on your current medications as directed. Please refer to the Current Medication list given to you today.  Follow up as needed  

## 2013-01-05 NOTE — Assessment & Plan Note (Signed)
The patient has known idiopathic hypertrophic subaortic stenosis.  She denies any chest pain.  She has not been having any severe dizzy spells or syncope.  She has not had any symptoms of heart failure.  Rhythm is sinus bradycardia on beta blocker.  She is also on amiodarone 200 mg daily

## 2013-01-05 NOTE — Assessment & Plan Note (Addendum)
The patient has not been aware of any recurrent atrial fibrillation.  EKG today confirms normal sinus rhythm.  She has been on chronic Lovenox daily since her fractured femur.

## 2013-01-06 ENCOUNTER — Non-Acute Institutional Stay (SKILLED_NURSING_FACILITY): Payer: Medicare Other | Admitting: Adult Health

## 2013-01-06 DIAGNOSIS — I4891 Unspecified atrial fibrillation: Secondary | ICD-10-CM

## 2013-01-06 DIAGNOSIS — I10 Essential (primary) hypertension: Secondary | ICD-10-CM

## 2013-01-06 DIAGNOSIS — F411 Generalized anxiety disorder: Secondary | ICD-10-CM

## 2013-01-06 DIAGNOSIS — F419 Anxiety disorder, unspecified: Secondary | ICD-10-CM

## 2013-01-06 DIAGNOSIS — F0390 Unspecified dementia without behavioral disturbance: Secondary | ICD-10-CM

## 2013-01-06 DIAGNOSIS — I480 Paroxysmal atrial fibrillation: Secondary | ICD-10-CM

## 2013-01-06 DIAGNOSIS — S7290XD Unspecified fracture of unspecified femur, subsequent encounter for closed fracture with routine healing: Secondary | ICD-10-CM

## 2013-01-06 DIAGNOSIS — K5900 Constipation, unspecified: Secondary | ICD-10-CM

## 2013-01-06 DIAGNOSIS — S7291XD Unspecified fracture of right femur, subsequent encounter for closed fracture with routine healing: Secondary | ICD-10-CM

## 2013-01-10 DIAGNOSIS — F419 Anxiety disorder, unspecified: Secondary | ICD-10-CM | POA: Insufficient documentation

## 2013-01-10 NOTE — Progress Notes (Signed)
Patient ID: Tricia [name], female   DOB: Dec 24, 1925, 79 y.o.   MRN: [id number]       PROGRESS NOTE  DATE:  12/02/12  FACILITY: Nursing Home Location: [facility]  LEVEL OF CARE: SNF (31)  Routine Visit  CHIEF COMPLAINT:  Manage Hypertension, Atrial Fibrillation and Dementia   HISTORY OF PRESENT ILLNESS:  REASSESSMENT OF ONGOING PROBLEM(S):  HTN: Pt 's HTN remains stable.  Denies CP, sob, DOE, pedal edema, headaches, dizziness or visual disturbances.  No complications from the medications currently being used.  Last BP : 128/76  CONSTIPATION: The constipation remains stable. No complications from the medications presently being used. Patient denies ongoing constipation, abdominal pain, nausea or vomiting.  DEMENTIA: The dementia remaines stable and continues to function adequately in the current living environment with supervision.  The patient has had little changes in behavior. No complications noted from the medications presently being used.  PAST MEDICAL HISTORY : Reviewed.  No changes.  CURRENT MEDICATIONS: Reviewed per [facility]  REVIEW OF SYSTEMS:  GENERAL: no change in appetite, no fatigue, no weight changes, no fever, chills or weakness RESPIRATORY: no cough, SOB, DOE, wheezing, hemoptysis CARDIAC: no chest pain, edema or palpitations GI: no abdominal pain, diarrhea, constipation, heart burn, nausea or vomiting  PHYSICAL EXAMINATION  VS:  T98      P76     RR20     BP128/76    POX96 %     WT 114.4(Lb)  GENERAL: no acute distress, normal body habitus NECK: supple, trachea midline, no neck masses, no thyroid tenderness, no thyromegaly LYMPHATICS: no LAN in the neck, no supraclavicular LAN RESPIRATORY: breathing is even & unlabored, BS CTAB CARDIAC: RRR, no murmur,no extra heart sounds, no edema GI: abdomen soft, normal BS, no masses, no tenderness, no hepatomegaly, no splenomegaly PSYCHIATRIC: the patient is alert & oriented to person, affect & behavior  appropriate  LABS/RADIOLOGY: 11/16/12 sodium 137 potassium 3.6 glucose 119 BUN 18 creatinine 0.9 calcium 9.4 WBC 5.2 hemoglobin 13.6 hematocrit 42.0  10/14/12 WBC 20.7 hemoglobin 10.9 hematocrit 32.0  10/12/12 WBC 5.2 hemoglobin 10.4 hematocrit 30.1  10/04/12 WBC 6.5 hemoglobin 8.3 hematocrit 24.6  09/27/12 WBC 6.4 hemoglobin 7.3 hematocrit 21.1 sodium 139 potassium 3.8 glucose 101 BUN 32 creatinine 0.623 calcium 7.9    ASSESSMENT/PLAN:   right femur fracture status post ORIF  - continue PT and OT   Hypertension  - well controlled   Atrial fibrillation  - rate controlled   Dementia - stable   Constipation - no complaints   CPT CODE:  40981

## 2013-01-10 NOTE — Progress Notes (Signed)
Patient ID: Tricia [name], female   DOB: 04/04/1925, 79 y.o.   MRN: [id number]       PROGRESS NOTE  DATE: 01/06/2013   FACILITY: [facility]  LEVEL OF CARE: SNF (31)  Acute Visit  CHIEF COMPLAINT:  Discharge Notes  HISTORY OF PRESENT ILLNESS: This is an 79 year old female who is for discharge to an assisted living facility. She has been admitted to [facility] on 09/24/12 from [facility]. She fell at home sustaining a right femur fracture S/P ORIF.Patient was admitted to this facility for short-term rehabilitation after the patient's recent hospitalization.  Patient has completed SNF rehabilitation and therapy has cleared the patient for discharge.  Reassessment of ongoing problem(s):  ATRIAL FIBRILLATION: the patients atrial fibrillation remains stable.  The patient denies DOE, tachycardia, orthopnea, transient neurological sx, pedal edema, palpitations, & PNDs.  No complications noted from the medications currently being used.  DEMENTIA: The dementia remaines stable and continues to function adequately in the current living environment with supervision.  The patient has had little changes in behavior. No complications noted from the medications presently being used.  HTN: Pt 's HTN remains stable.  Denies CP, sob, DOE, pedal edema, headaches, dizziness or visual disturbances.  No complications from the medications currently being used.  Last BP : 114/67  ANXIETY: The anxiety remains stable. Patient denies ongoing anxiety or irritability. No complications reported from the medications currently being used.  PAST MEDICAL HISTORY : Reviewed.  No changes.  CURRENT MEDICATIONS: Reviewed per [facility]  REVIEW OF SYSTEMS:  GENERAL: no change in appetite, no fatigue, no weight changes, no fever, chills or weakness RESPIRATORY: no cough, SOB, DOE, wheezing, hemoptysis CARDIAC: no chest pain, edema or palpitations GI: no abdominal pain, diarrhea, constipation, heart  burn, nausea or vomiting  PHYSICAL EXAMINATION  VS:  T98.1       P83       RR18      BP114/67      POX97 %       WT115 (Lb)  GENERAL: no acute distress, normal body habitus EYES: conjunctivae normal, sclerae normal, normal eye lids NECK: supple, trachea midline, no neck masses, no thyroid tenderness, no thyromegaly LYMPHATICS: no LAN in the neck, no supraclavicular LAN RESPIRATORY: breathing is even & unlabored, BS CTAB CARDIAC: RRR, no murmur,no extra heart sounds, no edema GI: abdomen soft, normal BS, no masses, no tenderness, no hepatomegaly, no splenomegaly PSYCHIATRIC: the patient is alert & oriented to person, affect & behavior appropriate  LABS/RADIOLOGY: 11/16/12 sodium 137 potassium 3.6 glucose 119 BUN 18 creatinine 0.9 calcium 9.4 WBC 5.2 hemoglobin 13.6 hematocrit 42.0  10/14/12 WBC 20.7 hemoglobin 10.9 hematocrit 32.0  10/12/12 WBC 5.2 hemoglobin 10.4 hematocrit 30.1  10/04/12 WBC 6.5 hemoglobin 8.3 hematocrit 24.6  09/27/12 WBC 6.4 hemoglobin 7.3 hematocrit 21.1 sodium 139 potassium 3.8 glucose 101 BUN 32 creatinine 0.623 calcium 7.9    ASSESSMENT/PLAN:  right femur fracture status post ORIF  - to be discharged to an assisted living facility  Hypertension  - well controlled   Atrial fibrillation  - rate controlled   Dementia - stable   Constipation - no complaints  Anxiety - recently increased Ativan to 1 mg PO TID PRN    I have filled out patient's discharge paperwork and written prescriptions.    Total discharge time: Less than 30 minutes Discharge time involved coordination of the discharge process with Child psychotherapist, nursing staff and therapy department.    CPT CODE: 40981

## 2013-01-18 ENCOUNTER — Other Ambulatory Visit: Payer: Self-pay | Admitting: Internal Medicine

## 2013-09-14 ENCOUNTER — Telehealth (INDEPENDENT_AMBULATORY_CARE_PROVIDER_SITE_OTHER): Payer: Self-pay

## 2013-09-14 NOTE — Telephone Encounter (Signed)
Forwarding attached msg to Dr Dalbert Batman.

## 2013-09-14 NOTE — Telephone Encounter (Signed)
Message copied by Dois Davenport on Wed Sep 14, 2013  9:14 AM ------      Message from: Tami Lin      Created: Tue Sep 13, 2013  4:03 PM      Regarding: Dr Adline Peals: [phone]       FYI   [facility]: per pt son, moved her to [place] to a clinic. Will not be coming back. ------

## 2013-10-12 ENCOUNTER — Other Ambulatory Visit: Payer: Self-pay | Admitting: *Deleted

## 2013-10-12 DIAGNOSIS — C50419 Malignant neoplasm of upper-outer quadrant of unspecified female breast: Secondary | ICD-10-CM

## 2013-10-13 ENCOUNTER — Other Ambulatory Visit: Payer: Medicare Other

## 2013-10-13 ENCOUNTER — Ambulatory Visit: Payer: Medicare Other | Admitting: Oncology

## 2014-05-17 IMAGING — CR DG CHEST 1V PORT
1 series · 1 of 1 positions shown · non-contrast
Comparison: 04/25/2010.CT 11/22/2010.

CLINICAL DATA: History of new onset of atrial fibrillation..
Previous history given of breast carcinoma.

PORTABLE CHEST - 1 VIEW

[AP]
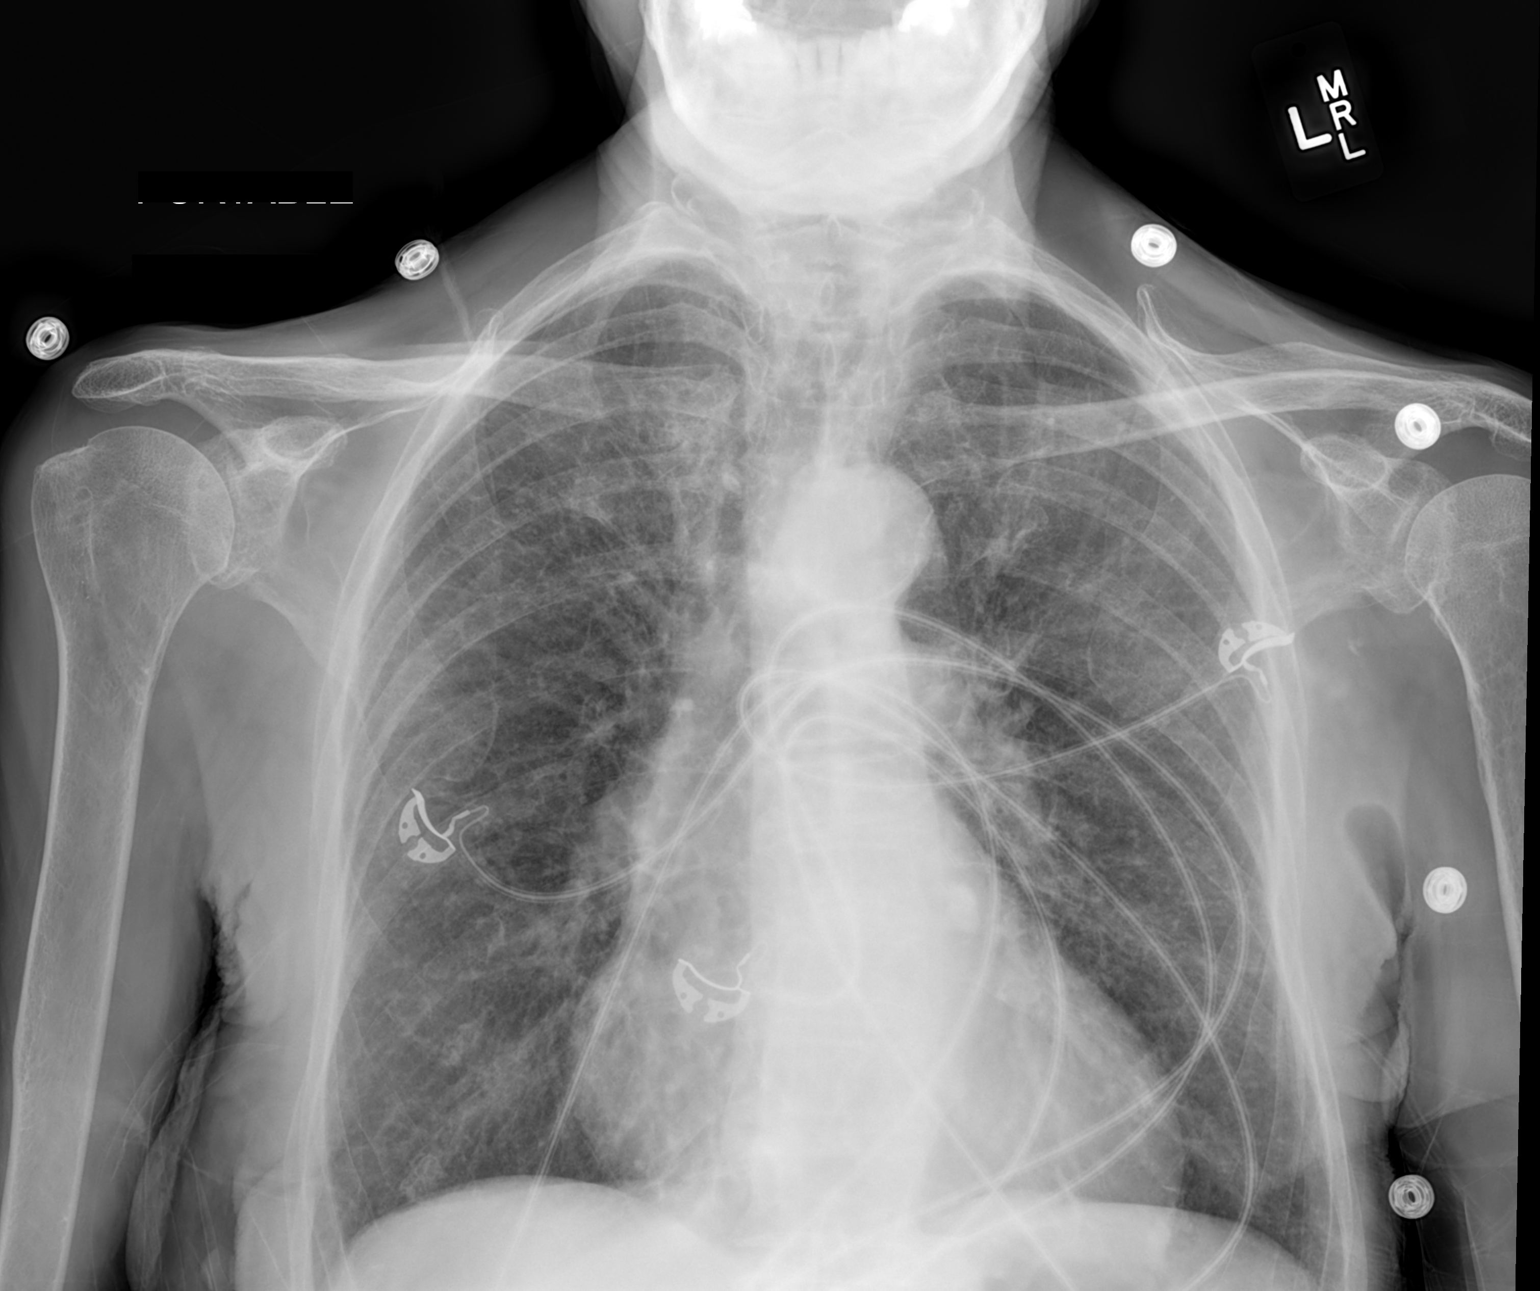

[1 of 1 positions shown; findings below may reference images not displayed]

FINDINGS: Portable semi erect examination is submitted.  There is
slight cardiac silhouette enlargement. Ectasia and nonaneurysmal
calcification of the thoracic aorta are seen.  There is slight
vascular congestion pattern accentuated by semi erect positioning.
There is slight hazy infiltrative density in the left upper midlung
area.  There is slight prominence of the reticular interstitial
markings.  No alveolar pulmonary edema, consolidation, or pleural
effusion is evident.  No pulmonary nodules or masses are seen.
There is slightly osteopenic appearance of the bones.
IMPRESSION: Slight cardiac silhouette enlargement.  Slight vascular congestion
pattern accentuated by semi-erect positioning.  Hazy infiltrative
density in left upper midlung area.  Slight prominence of reticular
interstitial markings.  No alveolar pulmonary edema, consolidation,
or pleural effusion is evident.

## 2014-05-30 DEATH — deceased

## 2014-09-24 IMAGING — CR DG HIP COMPLETE 2+V*R*
3 series · 3 of 3 positions shown · non-contrast
Comparison: None.

CLINICAL DATA: Right hip pain secondary to a fall.

RIGHT HIP - COMPLETE 2+ VIEW

[t pelvis ap]
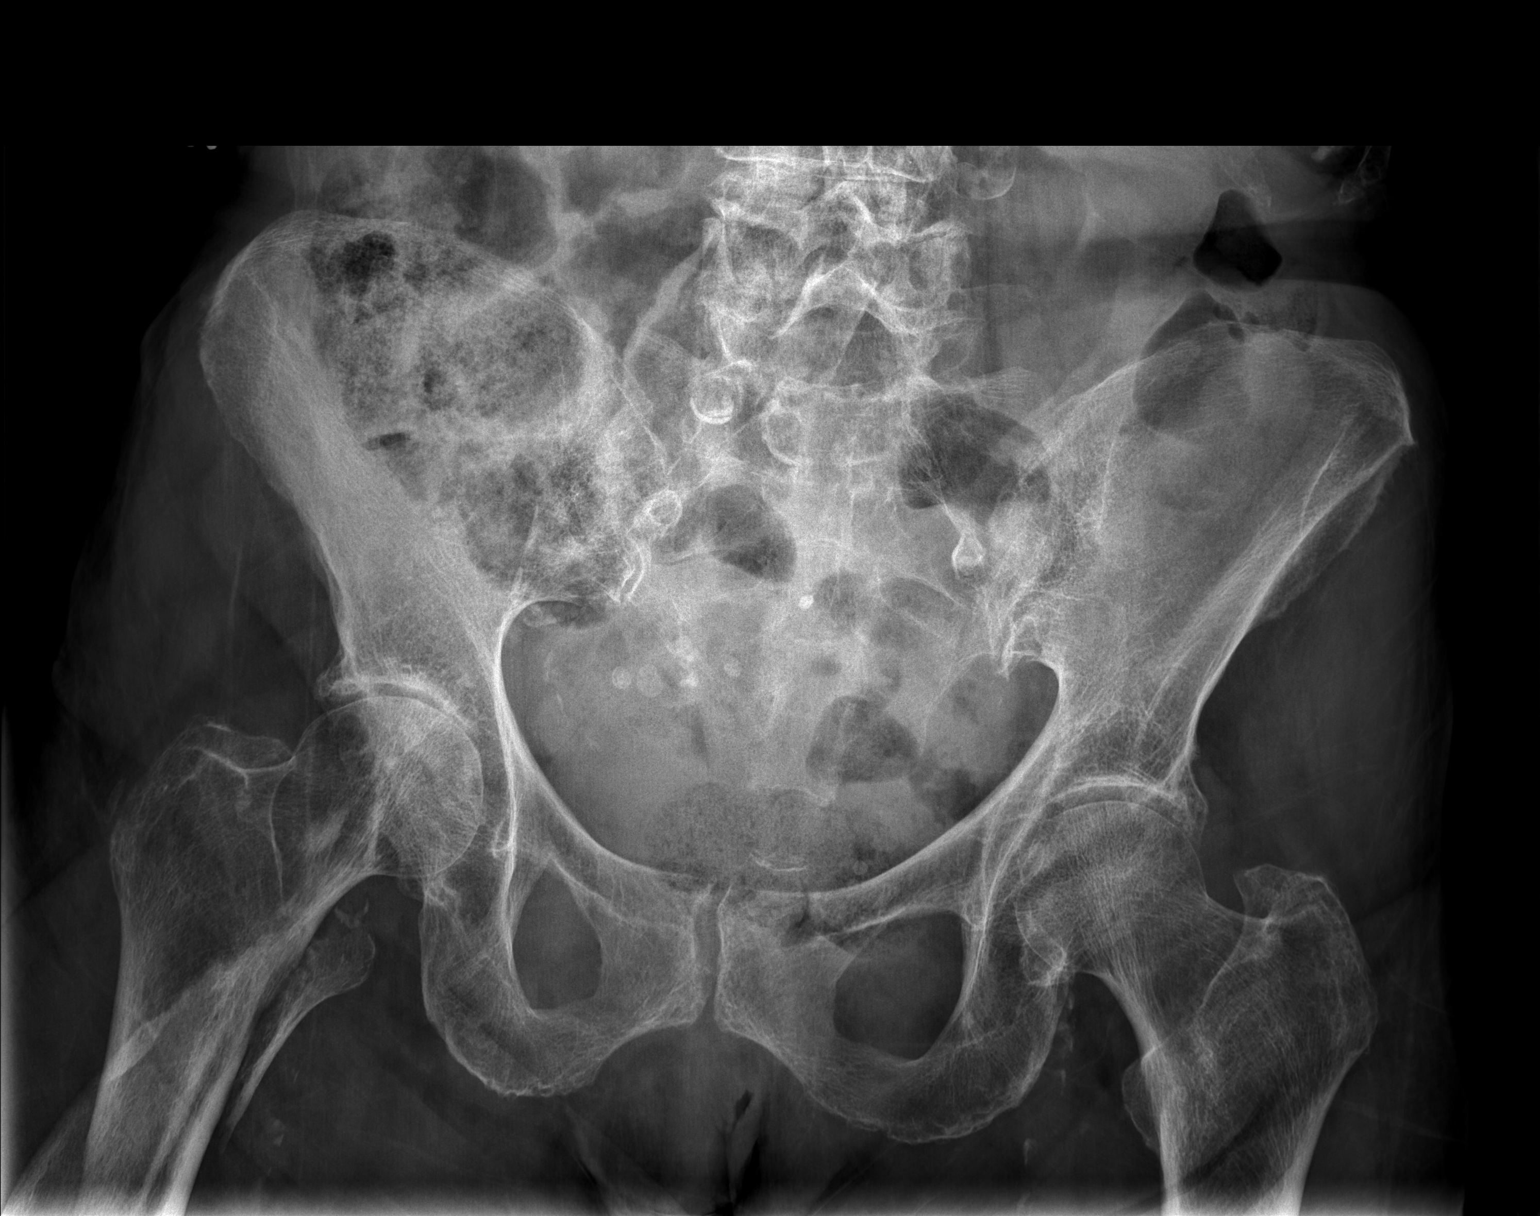

[t hip ap right]
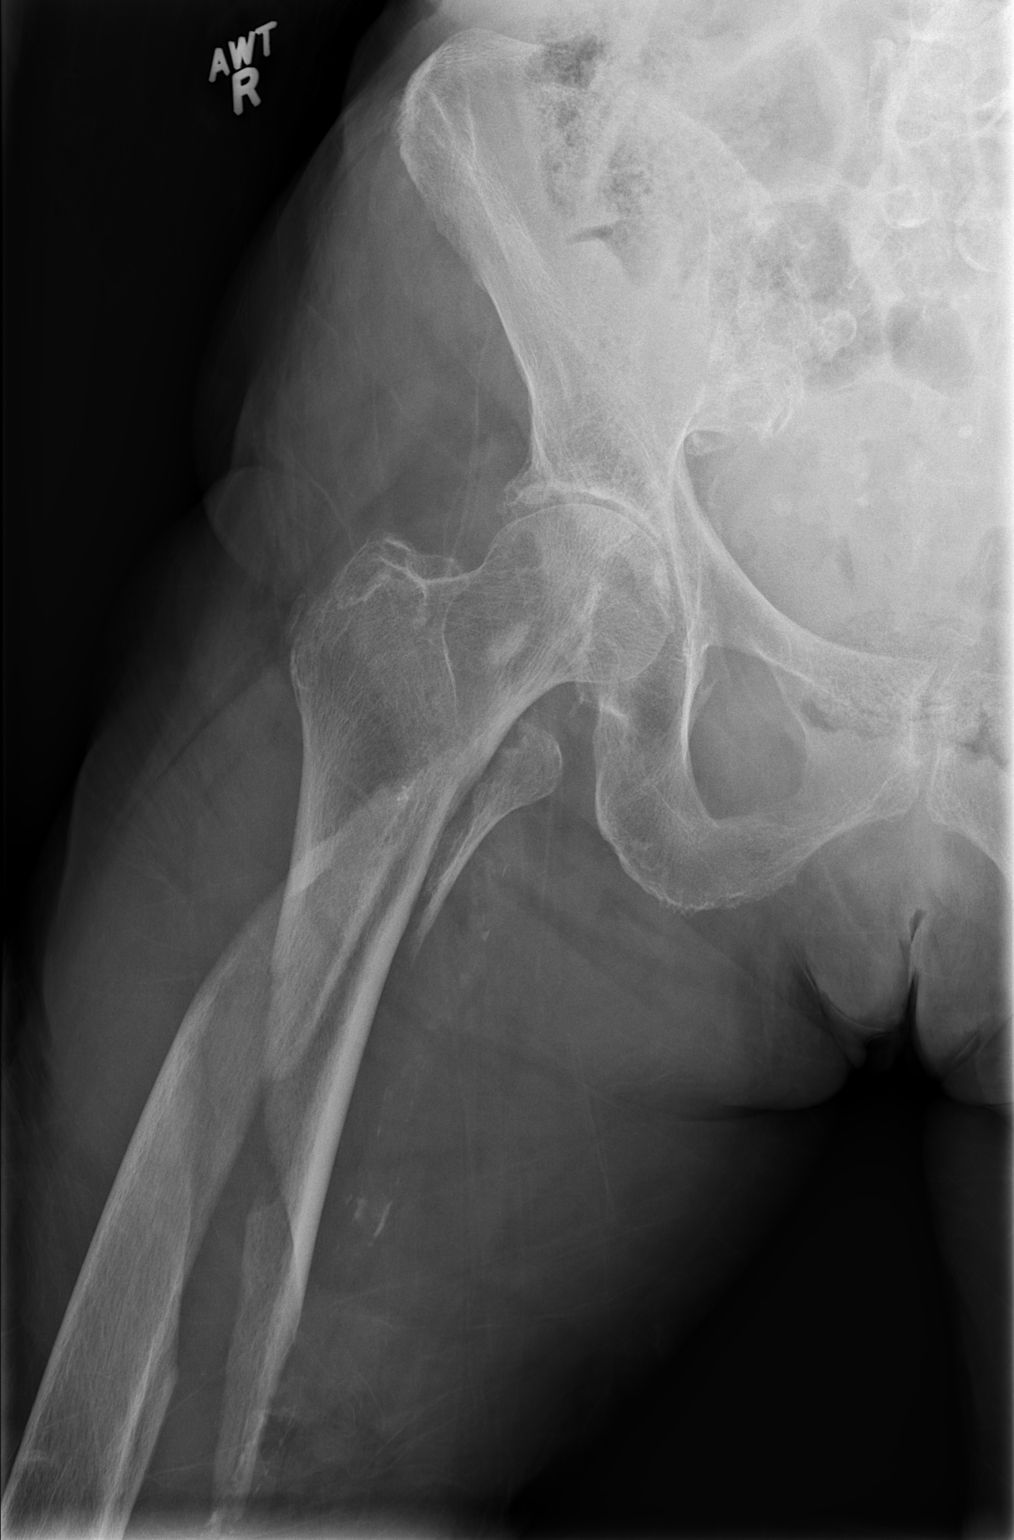

[w hip lat right]
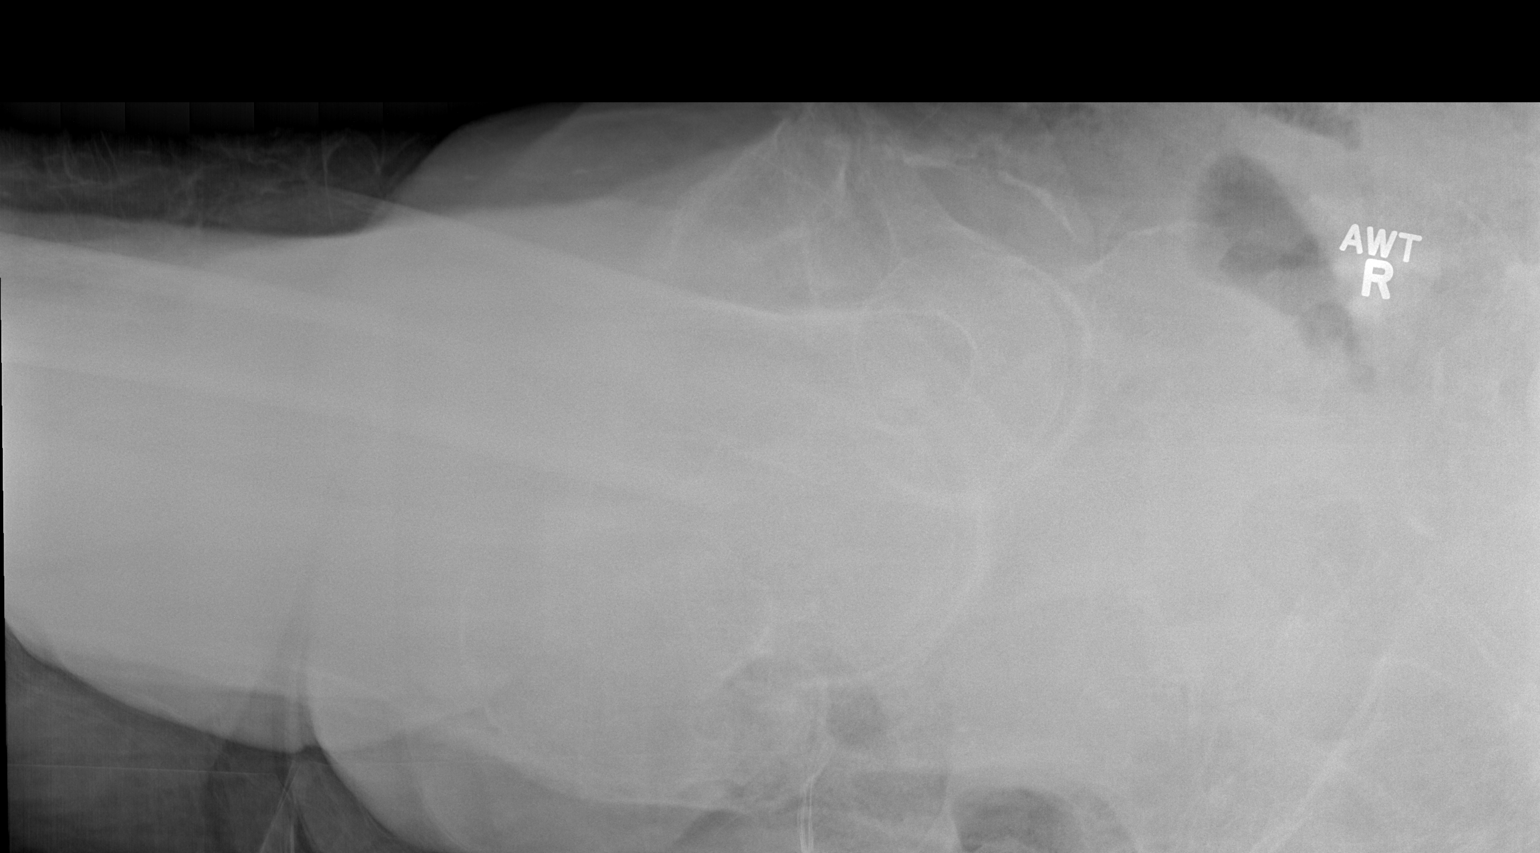

[3 of 3 positions shown; findings below may reference images not displayed]

FINDINGS: There is a comminuted spiral fracture of the proximal
femoral shaft extending 25 cm distally.  The fracture does include
avulsion of the lesser trochanter.  Fracture does not appear to
extend into the greater trochanter.
IMPRESSION: Spiral fracture of the proximal femoral shaft with angulation and
displacement.

## 2014-09-26 IMAGING — RF DG FEMUR 2+V*R*
1 series · 5 of 5 positions shown · non-contrast
Comparison: 09/21/2012

CLINICAL DATA: Fracture fixation

DG C-ARM 1-60 MIN - [facility],RIGHT FEMUR - 2 VIEW

[Series 1: run · 5 of 5 slices shown]
[im 1/5]
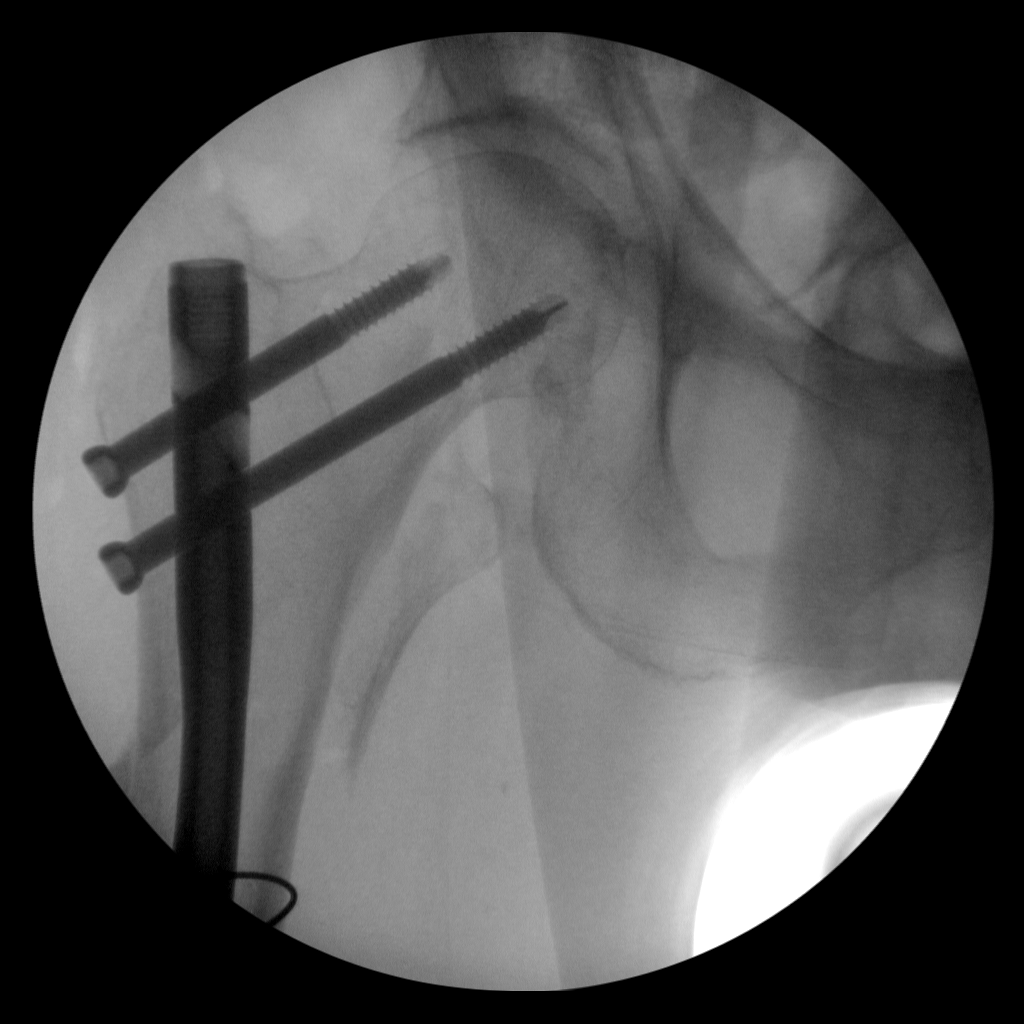
[im 2/5]
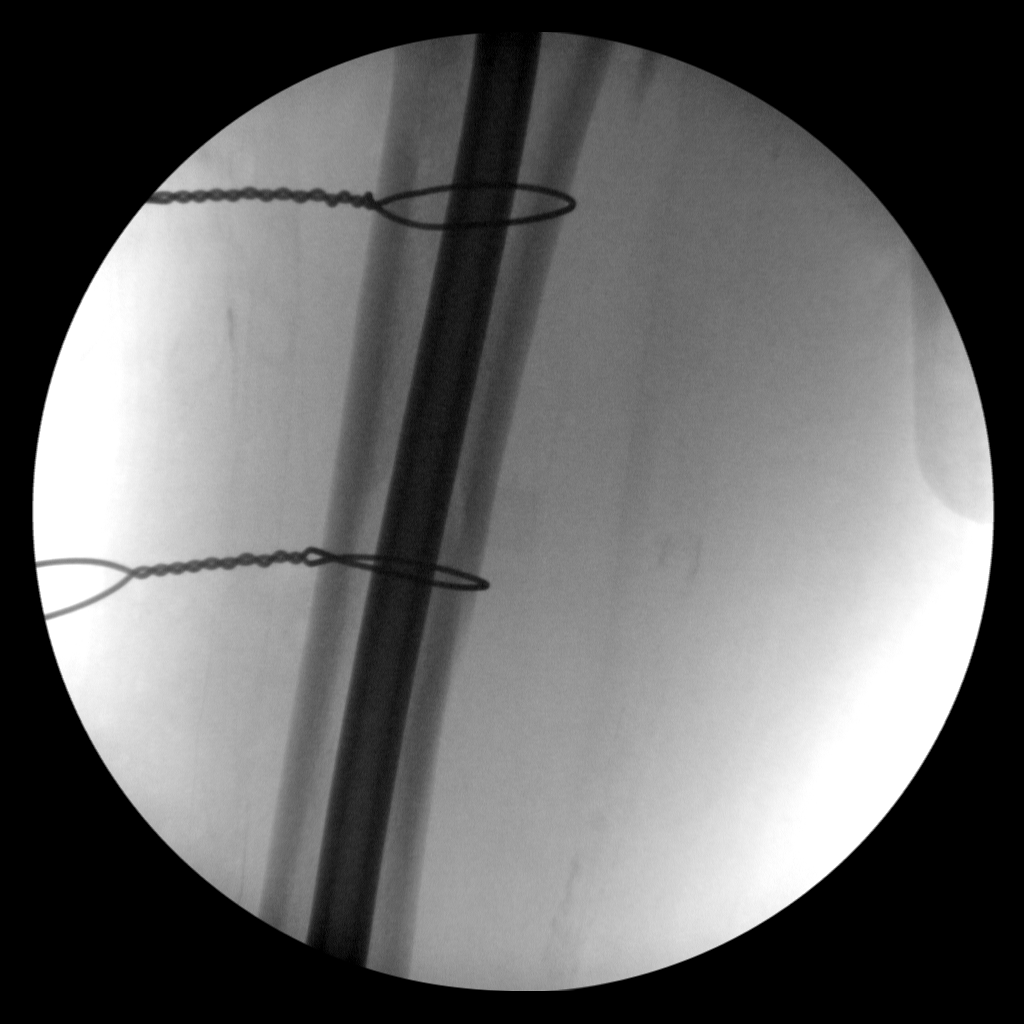
[im 3/5]
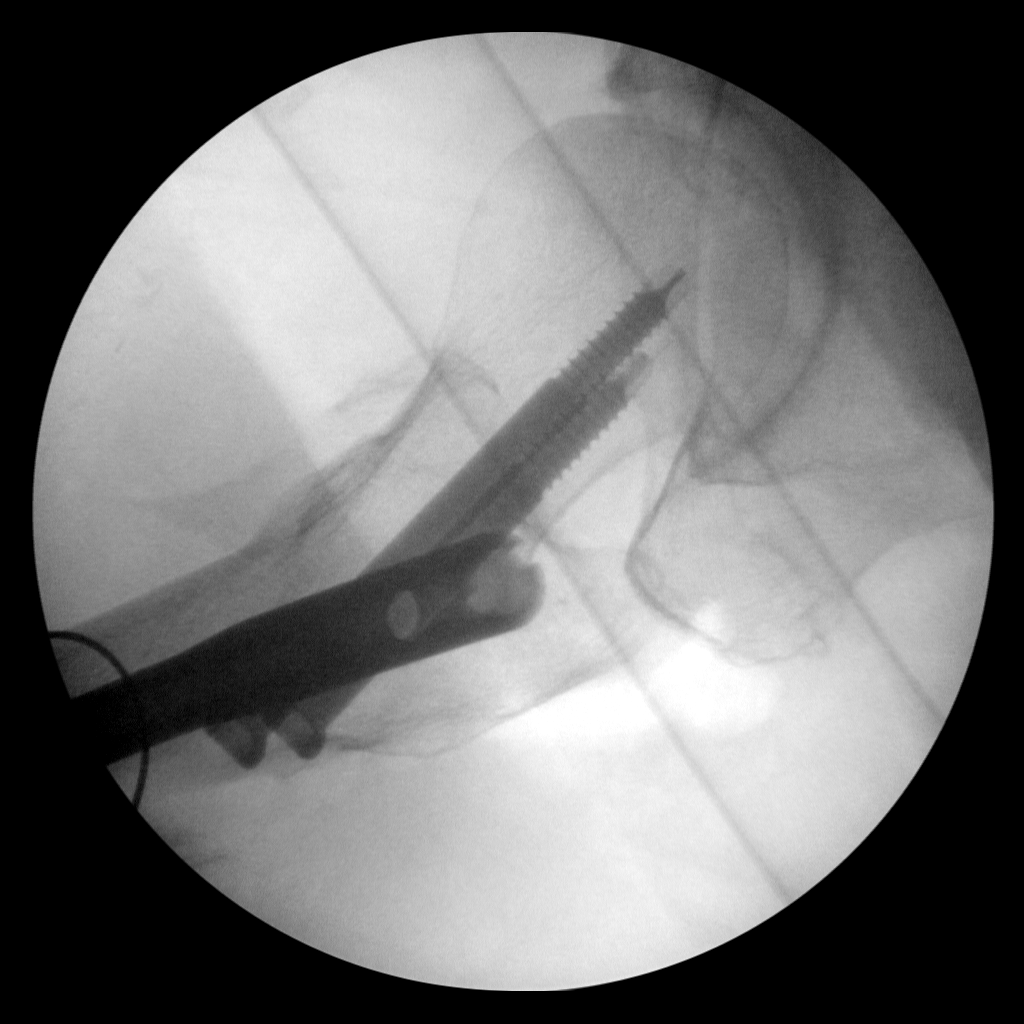
[im 4/5]
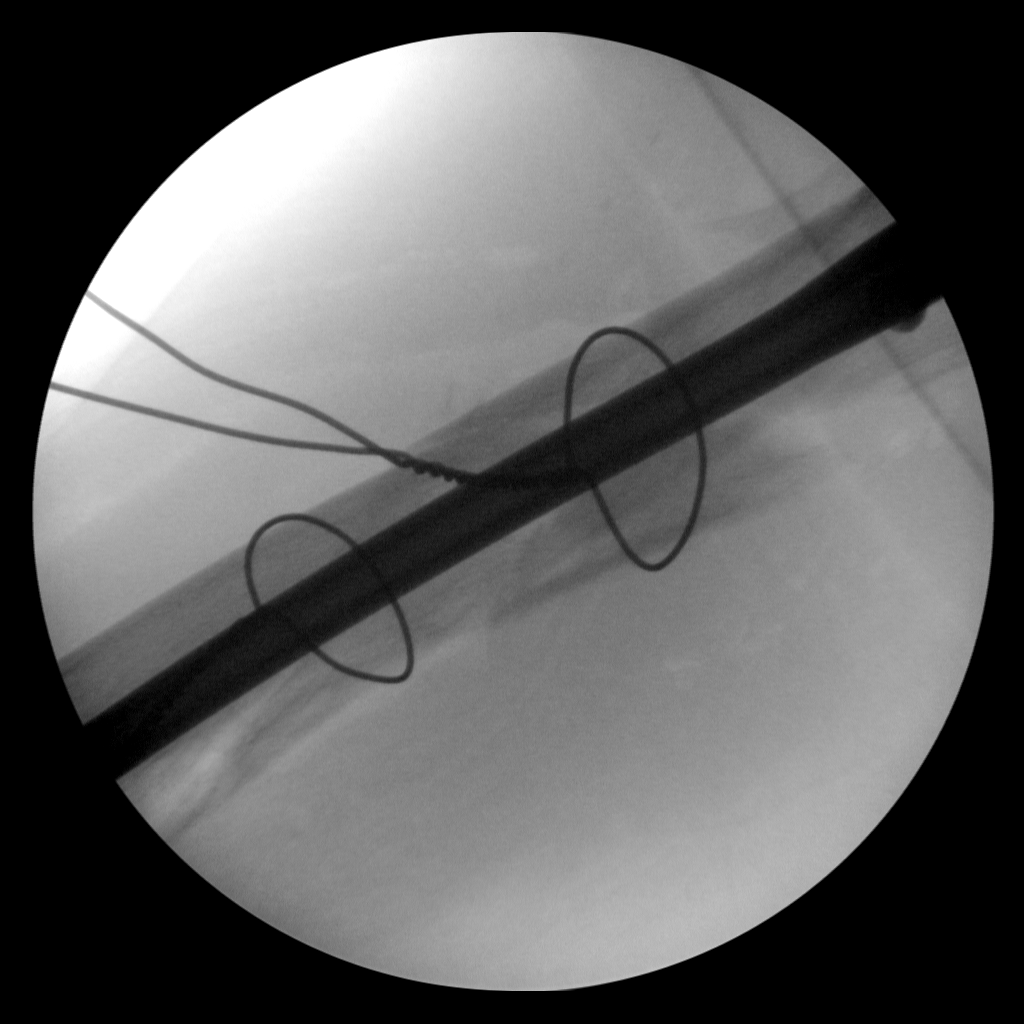
[im 5/5]
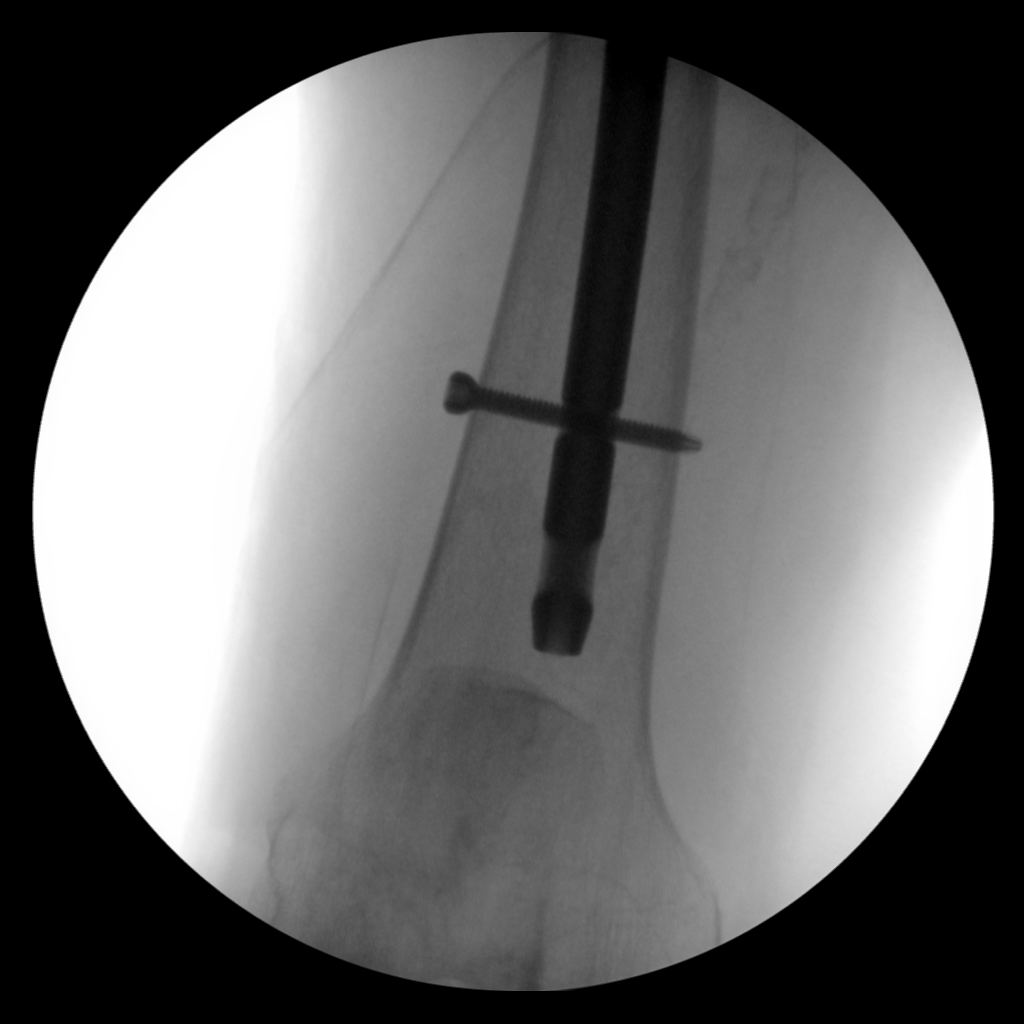

[5 of 5 positions shown; findings below may reference images not displayed]

FINDINGS: Spiral fracture in the femur has been fixed with a
locking intramedullary rod and cerclage wires.  Two  threaded
screws extend into the femoral head.  The lesser trochanter is
avulsed.

Fracture alignment appears satisfactory.
IMPRESSION: Orthopedic fixation of femoral fracture.
